# Patient Record
Sex: Male | Born: 1980
Health system: Southern US, Community
[De-identification: ages and names within clinical notes are randomized; demographics above are authoritative.]

## PROBLEM LIST (undated history)

## (undated) DIAGNOSIS — R9431 Abnormal electrocardiogram [ECG] [EKG]: Secondary | ICD-10-CM

## (undated) DIAGNOSIS — R739 Hyperglycemia, unspecified: Secondary | ICD-10-CM

## (undated) DIAGNOSIS — E119 Type 2 diabetes mellitus without complications: Secondary | ICD-10-CM

## (undated) DIAGNOSIS — N200 Calculus of kidney: Secondary | ICD-10-CM

## (undated) DIAGNOSIS — I10 Essential (primary) hypertension: Principal | ICD-10-CM

## (undated) HISTORY — DX: Type 2 diabetes mellitus without complications: E11.9

## (undated) HISTORY — DX: Hyperglycemia, unspecified: R73.9

## (undated) HISTORY — DX: Essential (primary) hypertension: I10

## (undated) HISTORY — DX: Abnormal electrocardiogram (ECG) (EKG): R94.31

## (undated) HISTORY — PX: NECK SURGERY: SHX720

## (undated) HISTORY — DX: Calculus of kidney: N20.0

---

## 2000-11-11 ENCOUNTER — Emergency Department (HOSPITAL_COMMUNITY): Admission: EM | Admit: 2000-11-11 | Discharge: 2000-11-11 | Payer: Self-pay | Admitting: Emergency Medicine

## 2002-03-29 ENCOUNTER — Emergency Department (HOSPITAL_COMMUNITY): Admission: EM | Admit: 2002-03-29 | Discharge: 2002-03-29 | Payer: Self-pay | Admitting: Emergency Medicine

## 2010-03-15 ENCOUNTER — Emergency Department (HOSPITAL_COMMUNITY): Admission: EM | Admit: 2010-03-15 | Discharge: 2010-03-15 | Payer: Self-pay | Admitting: Emergency Medicine

## 2010-07-31 LAB — RAPID URINE DRUG SCREEN, HOSP PERFORMED
Amphetamines: NOT DETECTED
Barbiturates: NOT DETECTED
Benzodiazepines: NOT DETECTED
Cocaine: NOT DETECTED
Opiates: NOT DETECTED
Tetrahydrocannabinol: NOT DETECTED

## 2010-07-31 LAB — POCT I-STAT, CHEM 8
BUN: 10 mg/dL (ref 6–23)
Calcium, Ion: 1.18 mmol/L (ref 1.12–1.32)
Chloride: 106 mEq/L (ref 96–112)
Creatinine, Ser: 1.5 mg/dL (ref 0.4–1.5)
Glucose, Bld: 107 mg/dL — ABNORMAL HIGH (ref 70–99)
HCT: 47 % (ref 39.0–52.0)
Hemoglobin: 16 g/dL (ref 13.0–17.0)
Potassium: 4 mEq/L (ref 3.5–5.1)
Sodium: 140 mEq/L (ref 135–145)
TCO2: 24 mmol/L (ref 0–100)

## 2010-07-31 LAB — D-DIMER, QUANTITATIVE: D-Dimer, Quant: <0.22 ug/mL-FEU (ref 0.00–0.48)

## 2010-07-31 LAB — TROPONIN I: Troponin I: <0.01 ng/mL (ref 0.00–0.06)

## 2010-11-11 ENCOUNTER — Encounter: Payer: Self-pay | Admitting: Family

## 2010-11-11 ENCOUNTER — Ambulatory Visit (INDEPENDENT_AMBULATORY_CARE_PROVIDER_SITE_OTHER): Payer: 59 | Admitting: Family

## 2010-11-11 VITALS — BP 140/102 | HR 102 | Temp 97.8°F | Resp 16 | Ht 71.5 in | Wt 265.1 lb

## 2010-11-11 DIAGNOSIS — Z0000 Encounter for general adult medical examination without abnormal findings: Secondary | ICD-10-CM

## 2010-11-11 DIAGNOSIS — Z23 Encounter for immunization: Secondary | ICD-10-CM

## 2010-11-11 DIAGNOSIS — I10 Essential (primary) hypertension: Secondary | ICD-10-CM | POA: Insufficient documentation

## 2010-11-11 DIAGNOSIS — L918 Other hypertrophic disorders of the skin: Secondary | ICD-10-CM

## 2010-11-11 DIAGNOSIS — R9431 Abnormal electrocardiogram [ECG] [EKG]: Secondary | ICD-10-CM

## 2010-11-11 DIAGNOSIS — E663 Overweight: Secondary | ICD-10-CM

## 2010-11-11 DIAGNOSIS — R002 Palpitations: Secondary | ICD-10-CM | POA: Insufficient documentation

## 2010-11-11 DIAGNOSIS — L909 Atrophic disorder of skin, unspecified: Secondary | ICD-10-CM

## 2010-11-11 HISTORY — DX: Abnormal electrocardiogram (ECG) (EKG): R94.31

## 2010-11-11 HISTORY — DX: Essential (primary) hypertension: I10

## 2010-11-11 LAB — CBC
Hemoglobin: 15.3 g/dL (ref 13.0–17.0)
MCHC: 35.1 g/dL (ref 30.0–36.0)
RDW: 14 % (ref 11.5–15.5)

## 2010-11-11 LAB — HEPATIC FUNCTION PANEL
ALT: 36 U/L (ref 0–53)
AST: 25 U/L (ref 0–37)
Albumin: 4.7 g/dL (ref 3.5–5.2)
Total Bilirubin: 0.4 mg/dL (ref 0.3–1.2)

## 2010-11-11 LAB — BASIC METABOLIC PANEL
BUN: 14 mg/dL (ref 6–23)
Creat: 1.42 mg/dL — ABNORMAL HIGH (ref 0.50–1.35)
Glucose, Bld: 71 mg/dL (ref 70–99)
Potassium: 4.6 mEq/L (ref 3.5–5.3)

## 2010-11-11 LAB — TSH: TSH: 1.185 u[IU]/mL (ref 0.350–4.500)

## 2010-11-11 MED ORDER — ASPIRIN EC 81 MG PO TBEC: 81.0000 mg | DELAYED_RELEASE_TABLET | Freq: Every day | ORAL | Status: DC

## 2010-11-11 MED ORDER — AMLODIPINE BESYLATE 5 MG PO TABS: 5.0000 mg | ORAL_TABLET | Freq: Every day | ORAL | Status: DC

## 2010-11-11 NOTE — Patient Instructions (Addendum)
Www.sparkpeople.com - this is a good website with an app for your smartphone to record calories/exercise. Try to keep your calories to between 1500 and 1700 calories day. Keep up the good work with the exercise.   Complete your blood work on the first floor. Follow up in 1 month- bring a copy of your cholesterol and PSA tests from your health screen with you to your follow up appointment.  You will be contacted about your referral to cardiology. Go to the ER if you develop chest pain.

## 2010-11-11 NOTE — Progress Notes (Signed)
Subjective:    Patient ID: Ralph [name], male    DOB: 1980/08/20, 30 y.o.   MRN: [id number]  HPI  [name] is a 30 yr old male who presents today to establish care.  He was recently told at a work health fair that his blood pressure was elevated.   HTN- told at health fair that his blood pressure is high. Mom and dad have HTN.    Preventative-  Exercise- lifts weights/cardio 5x a week- treadmill/elliptical.  Last tetanus>10 yrs ago.  Diet is "terrible."     Review of Systems  Constitutional: Negative for fever.  HENT: Negative for hearing loss.   Eyes: Negative for visual disturbance.  Respiratory: Negative for chest tightness and wheezing.   Cardiovascular: Positive for palpitations. Negative for chest pain and leg swelling.  Gastrointestinal: Negative for nausea, vomiting, abdominal pain and diarrhea.  Genitourinary: Negative for dysuria, urgency and frequency.  Musculoskeletal: Positive for back pain.  Skin: Negative for wound.       + mole under arm- enlarging.  Neurological: Negative for weakness and numbness.  Hematological: Negative for adenopathy.  Psychiatric/Behavioral:       Denies depression/anxiety.   Past Medical History  Diagnosis Date  . History of chicken pox   . Elevated blood pressure reading     History   Social History  . Marital Status: Single    Spouse Name: N/A    Number of Children: 2  . Years of Education: N/A   Occupational History  .  [employer]   Social History Main Topics  . Smoking status: Former Smoker -- 6 years    Types: Cigarettes    Quit date: 08/18/2010  . Smokeless tobacco: Never Used  . Alcohol Use: Yes     1-2 drinks liquor a month  . Drug Use: No  . Sexually Active: Not on file   Other Topics Concern  . 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 Denies drug  use.      Past Surgical History  Procedure Date  . 1988     Family History  Problem Relation Age of Onset  . Diabetes Father   . Hypertension Maternal Grandfather   . Diabetes Paternal Grandmother   . Cancer Neg Hx   . Heart disease Neg Hx     No Known Allergies  No current outpatient prescriptions on file prior to visit.    BP 140/102  Pulse 102  Temp(Src) 97.8 F (36.6 C) (Oral)  Resp 16  Ht 5' 11.5" (1.816 m)  Wt 265 lb 1.3 oz (120.239 kg)  BMI 36.46 kg/m2        Objective:   Physical Exam  Constitutional: He is oriented to person, place, and time. He appears well-developed and well-nourished.  HENT:  Head: Normocephalic and atraumatic.  Right Ear: Ear canal normal.  Left Ear: Tympanic membrane and ear canal normal.  Mouth/Throat: Uvula is midline, oropharynx is clear and moist and mucous membranes are normal.  Eyes: Conjunctivae are normal. Pupils are equal, round, and reactive to light.  Cardiovascular: Normal rate and regular rhythm.   Pulmonary/Chest: Effort normal and breath sounds normal.  Abdominal: Soft. Bowel sounds are normal.  Musculoskeletal: Normal range of motion. He exhibits no edema and no tenderness.  Neurological: He is alert and oriented  to person, place, and time. He displays normal reflexes.  Skin: Skin is warm and dry.       Skin lesion of concern- right axilla is a skin tag.  Multiple skin tags noted around neck line.           Assessment & Plan:

## 2010-11-11 NOTE — Assessment & Plan Note (Signed)
Tetanus given today.  Pt counseled on diet, exercise, weight loss and low sodium diet. Check baseline laboratories.  He reports that PSA and lipids were completed at his work fair- I have asked him to bring these with him to his follow up appointment.

## 2010-11-11 NOTE — Assessment & Plan Note (Signed)
New problem, will add norvasc, pt to f/u in 1 month.

## 2010-11-11 NOTE — Assessment & Plan Note (Signed)
Refer to cardiology.  Check BMET, TSH, CBC.

## 2010-11-11 NOTE — Assessment & Plan Note (Signed)
EKG performed today notes TWI's leads I, II, III, V5 and V6.  Compared to EKG 03/15/10 and noted that EKG was very similar.  Pt denies chest pain.  Will add a baby aspirin and refer to cardiology for consultation.  Pt was instructed to go to the ER if he develops chest pain.  He verbalizes understanding.

## 2010-11-12 ENCOUNTER — Telehealth: Payer: Self-pay | Admitting: Family

## 2010-11-12 ENCOUNTER — Encounter: Payer: Self-pay | Admitting: Family

## 2010-11-12 NOTE — Telephone Encounter (Signed)
Pt.notified

## 2010-11-12 NOTE — Telephone Encounter (Signed)
Left message on machine to return my call. 

## 2010-11-12 NOTE — Telephone Encounter (Signed)
Pt would like results from yesterdays labs

## 2010-12-05 ENCOUNTER — Encounter: Payer: Self-pay | Admitting: Internal Medicine

## 2010-12-06 ENCOUNTER — Ambulatory Visit (INDEPENDENT_AMBULATORY_CARE_PROVIDER_SITE_OTHER): Payer: 59 | Admitting: Internal Medicine

## 2010-12-06 ENCOUNTER — Encounter: Payer: Self-pay | Admitting: Internal Medicine

## 2010-12-06 VITALS — BP 144/97 | HR 77 | Ht 70.0 in | Wt 259.0 lb

## 2010-12-06 DIAGNOSIS — R9431 Abnormal electrocardiogram [ECG] [EKG]: Secondary | ICD-10-CM

## 2010-12-06 DIAGNOSIS — I10 Essential (primary) hypertension: Secondary | ICD-10-CM

## 2010-12-06 DIAGNOSIS — Z0000 Encounter for general adult medical examination without abnormal findings: Secondary | ICD-10-CM

## 2010-12-06 MED ORDER — AMLODIPINE BESYLATE 5 MG PO TABS: 5.0000 mg | ORAL_TABLET | Freq: Every day | ORAL | Status: DC

## 2010-12-06 NOTE — Progress Notes (Signed)
HPI  Patient is a 30 year old with a history of hypertension.  He was seen in medicine clinic an referred fro further evaluation The patient has had a his blood pressure checked on several occasions and it has been elevated.  He was seen in medicine clinic an given an Rx for Norvasc.  He did not fill the prescription.  An EKG was done though not sent with records He denies chest pain.  Breathing is OK  No palpitations.  No dizziness.  No Known Allergies  No current outpatient prescriptions on file.    Past Medical History  Diagnosis Date  . History of chicken pox   . Elevated blood pressure reading     Past Surgical History  Procedure Date  . 1988     Family History  Problem Relation Age of Onset  . Diabetes Father   . Hypertension Maternal Grandfather   . Diabetes Paternal Grandmother   . Cancer Neg Hx   . Heart disease Neg Hx     History   Social History  . Marital Status: Single    Spouse Name: N/A    Number of Children: 2  . Years of Education: N/A   Occupational History  .  [employer]   Social History Main Topics  . Smoking status: Former Smoker -- 6 years    Types: Cigarettes    Quit date: 08/18/2010  . Smokeless tobacco: Never Used  . Alcohol Use: Yes     1-2 drinks liquor a month  . Drug Use: No  . Sexually Active: Not on file   Other Topics Concern  . 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 Denies drug use.      Review of Systems:  All systems reviewed.  They are negative to the above problem except as previously stated.  Vital Signs: BP 144/97  Pulse 77  Ht 5\' 10"  (1.778 m)  Wt 259 lb (117.482 kg)  BMI 37.16 kg/m2  Physical Exam  Patient is in NAD  HEENT:  Normocephalic, atraumatic. EOMI, PERRLA.  Neck: JVP is normal. No thyromegaly. No bruits.  Lungs: clear to auscultation. No  rales no wheezes.  Heart: Regular rate and rhythm. Normal S1, S2. No S3.   No significant murmurs. PMI not displaced.  Abdomen:  Supple, nontender. Normal bowel sounds. No masses. No hepatomegaly.  Extremities:   Good distal pulses throughout. No lower extremity edema.  Musculoskeletal :moving all extremities.  Neuro:   alert and oriented x3.  CN II-XII grossly intact.  IEG:  Sinus rhythm.  T wave inversion II, III, AVF, V4 to V6.   Assessment and Plan:

## 2010-12-06 NOTE — Patient Instructions (Signed)
Your physician has requested that you have an echocardiogram. Echocardiography is a painless test that uses sound waves to create images of your heart. It provides your doctor with information about the size and shape of your heart and how well your heart's chambers and valves are working. This procedure takes approximately one hour. There are no restrictions for this procedure.  Change Dr.O'Sullivan's appointment to 1 month from now.

## 2010-12-08 NOTE — Assessment & Plan Note (Signed)
May be related to HTN.  Will order echo.

## 2010-12-08 NOTE — Assessment & Plan Note (Signed)
BP is elevated today.  I agree with Norvasc.  He has not filled the prescription yet.  WIll represcribe.   Pateint should have f/u appt in medicine in 4 wks.  (was scheduled for MOnday, will reschedule).

## 2010-12-08 NOTE — Assessment & Plan Note (Signed)
Will set up for fasting lipids when he returns for echo.

## 2010-12-09 ENCOUNTER — Ambulatory Visit: Payer: 59 | Admitting: Family

## 2010-12-09 ENCOUNTER — Ambulatory Visit (HOSPITAL_COMMUNITY): Payer: 59 | Attending: Internal Medicine | Admitting: Radiology

## 2010-12-09 DIAGNOSIS — I079 Rheumatic tricuspid valve disease, unspecified: Secondary | ICD-10-CM | POA: Insufficient documentation

## 2010-12-09 DIAGNOSIS — I059 Rheumatic mitral valve disease, unspecified: Secondary | ICD-10-CM | POA: Insufficient documentation

## 2010-12-09 DIAGNOSIS — I10 Essential (primary) hypertension: Secondary | ICD-10-CM | POA: Insufficient documentation

## 2010-12-09 DIAGNOSIS — R9431 Abnormal electrocardiogram [ECG] [EKG]: Secondary | ICD-10-CM | POA: Insufficient documentation

## 2010-12-13 ENCOUNTER — Telehealth: Payer: Self-pay | Admitting: Internal Medicine

## 2010-12-13 NOTE — Telephone Encounter (Signed)
PT RTN CALL TO RESULTS OF ECHO

## 2010-12-13 NOTE — Telephone Encounter (Signed)
Results of echo given to patient. 

## 2011-01-06 ENCOUNTER — Ambulatory Visit (INDEPENDENT_AMBULATORY_CARE_PROVIDER_SITE_OTHER): Payer: 59 | Admitting: Family

## 2011-01-06 ENCOUNTER — Encounter: Payer: Self-pay | Admitting: Family

## 2011-01-06 DIAGNOSIS — R9431 Abnormal electrocardiogram [ECG] [EKG]: Secondary | ICD-10-CM

## 2011-01-06 DIAGNOSIS — I10 Essential (primary) hypertension: Secondary | ICD-10-CM

## 2011-01-06 MED ORDER — OLMESARTAN MEDOXOMIL 20 MG PO TABS: 20.0000 mg | ORAL_TABLET | Freq: Every day | ORAL | Status: DC

## 2011-01-06 NOTE — Progress Notes (Signed)
  Subjective:    Patient ID: Ralph [name], male    DOB: 1980/08/15, 30 y.o.   MRN: [id number]  HPI  [name] is a 30 yr old male who presents today for follow up of his HTN.     HTN- pt was recently diagnosed with HTN.   He is currently on amlodipine, and not well controlled. He complains of erectile dysfunction on Amlodipine and would like to try a different agent. No associated S/S related to HTN.   Quality: chronic Modifying factor: meds Duration: Diagnosed about 1 month ago.  Associated S/S: None.  The patient denies the following associated symptoms: Chest pain, dyspnea, blurred vision, headache, or lower extremity edema.       Review of Systems See HPI  Past Medical History  Diagnosis Date  . History of chicken pox   . Elevated blood pressure reading     History   Social History  . Marital Status: Single    Spouse Name: N/A    Number of Children: 2  . Years of Education: N/A   Occupational History  .  [employer]   Social History Main Topics  . Smoking status: Former Smoker -- 6 years    Types: Cigarettes    Quit date: 08/18/2010  . Smokeless tobacco: Never Used  . Alcohol Use: Yes     1-2 drinks liquor a month  . Drug Use: No  . Sexually Active: Not on file   Other Topics Concern  . 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 Denies drug use.      Past Surgical History  Procedure Date  . 1988     Family History  Problem Relation Age of Onset  . Diabetes Father   . Hypertension Maternal Grandfather   . Diabetes Paternal Grandmother   . Cancer Neg Hx   . Heart disease Neg Hx     No Known Allergies  No current outpatient prescriptions on file prior to visit.    BP 140/100  Pulse 78  Temp(Src) 97.8 F (36.6 C) (Oral)  Resp 16  Ht 5' 11.5" (1.816 m)  Wt 259 lb 1.3 oz (117.518  kg)  BMI 35.63 kg/m2       Objective:   Physical Exam  Constitutional: He appears well-developed and well-nourished.  Cardiovascular: Normal rate and regular rhythm.   Pulmonary/Chest: Effort normal and breath sounds normal. No respiratory distress. He has no wheezes. He has no rales. He exhibits no tenderness.  Musculoskeletal: He exhibits no edema.  Psychiatric: He has a normal mood and affect. His behavior is normal. Judgment and thought content normal.          Assessment & Plan:  [id number] exp 08/14

## 2011-01-06 NOTE — Patient Instructions (Signed)
Stop amlodipine, start benicar. Follow up in 2 weeks.

## 2011-01-06 NOTE — Assessment & Plan Note (Signed)
Saw Cardiology- Dr. Tenny Craw.  Had Echo performed which showed normal LVEF.  No further work up planned per cards.

## 2011-01-06 NOTE — Assessment & Plan Note (Signed)
BP is unchanged.  He wishes to stop amlodipine. Will give him a trial of Benicar.  Pt to f/u in 2 weeks.

## 2011-01-27 ENCOUNTER — Ambulatory Visit (INDEPENDENT_AMBULATORY_CARE_PROVIDER_SITE_OTHER): Payer: 59 | Admitting: Family

## 2011-01-27 ENCOUNTER — Encounter: Payer: Self-pay | Admitting: Family

## 2011-01-27 VITALS — BP 132/86 | HR 84 | Temp 98.1°F | Resp 16 | Ht 71.5 in | Wt 261.0 lb

## 2011-01-27 DIAGNOSIS — R9431 Abnormal electrocardiogram [ECG] [EKG]: Secondary | ICD-10-CM

## 2011-01-27 DIAGNOSIS — I10 Essential (primary) hypertension: Secondary | ICD-10-CM

## 2011-01-27 LAB — BASIC METABOLIC PANEL
Potassium: 4.4 mEq/L (ref 3.5–5.3)
Sodium: 137 mEq/L (ref 135–145)

## 2011-01-27 MED ORDER — OLMESARTAN MEDOXOMIL 20 MG PO TABS: 20.0000 mg | ORAL_TABLET | Freq: Every day | ORAL | Status: DC

## 2011-01-27 NOTE — Assessment & Plan Note (Signed)
BP Readings from Last 3 Encounters:  01/27/11 132/86  01/06/11 140/100  12/06/10 144/97  BP is improved.  Recommended to the patient that he start back on the Benicar and return in 2 weeks to have BP rechecked as I would like to see his pressure on the Benicar. Check BMET today.

## 2011-01-27 NOTE — Patient Instructions (Signed)
Please complete your blood work on the first floor. Stop amlodipine, start back on benicar. Follow up in 2 weeks for a nurse visit blood pressure check. Follow up in 3 months for a regular appointment. Keep working hard on a low sodium diet, exercise and weight loss.

## 2011-01-27 NOTE — Progress Notes (Signed)
  Subjective:    Patient ID: KOTY ANCTIL, male    DOB: 1981-04-29, 30 y.o.   MRN: [id number]  HPI  Mr.  Deren is a 30 yr old male who presents today for follow up of his HTN.   1) HTN- He had originally been started on amlodipine but this was changed to benicar due to ED side effect.  He reports that he has tolerated the benicar without any adverse side effects such as ED.  He did run out of the the Benicar 3 days ago and temporarily started back on amlodipine.   Quality: chronic Modifying factor: meds Duration: Quite sometime Associated S/S: None.  The patient denies the following associated symptoms: Chest pain, dyspnea, headache, or lower extremity edema.  2) Abnormal EKG- pt was seen by Dr. Tenny Craw cardiology and it was recommended that he add a baby aspirin to his regimen and complete a 2-D echo.  2D Echo noted normal LVEF 55-60%     Review of Systems See HPI  Past Medical History  Diagnosis Date  . History of chicken pox   . Elevated blood pressure reading     History   Social History  . Marital Status: Single    Spouse Name: N/A    Number of Children: 2  . Years of Education: N/A   Occupational History  .  [employer]   Social History Main Topics  . Smoking status: Former Smoker -- 6 years    Types: Cigarettes    Quit date: 08/18/2010  . Smokeless tobacco: Never Used  . Alcohol Use: Yes     1-2 drinks liquor a month  . Drug Use: No  . Sexually Active: Not on file   Other Topics Concern  . 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 Denies drug use.      Past Surgical History  Procedure Date  . 1988     Family History  Problem Relation Age of Onset  . Diabetes Father   . Hypertension Maternal Grandfather   . Diabetes Paternal Grandmother   . Cancer Neg Hx   . Heart disease Neg Hx     No  Known Allergies  No current outpatient prescriptions on file prior to visit.    BP 132/86  Pulse 84  Temp(Src) 98.1 F (36.7 C) (Oral)  Resp 16  Ht 5' 11.5" (1.816 m)  Wt 261 lb (118.389 kg)  BMI 35.89 kg/m2       Objective:   Physical Exam  Constitutional: He appears well-developed and well-nourished.  Cardiovascular: Normal rate and regular rhythm.   No murmur heard. Pulmonary/Chest: Effort normal and breath sounds normal. No respiratory distress. He has no wheezes. He has no rales. He exhibits no tenderness.  Musculoskeletal: He exhibits no edema.  Psychiatric: His behavior is normal. Judgment and thought content normal.          Assessment & Plan:  benicar 20 lot [id number] exp 10/14 #21

## 2011-01-27 NOTE — Assessment & Plan Note (Signed)
Was evaluated by Dr. Tenny Craw.  Echo looks good.

## 2011-01-30 ENCOUNTER — Telehealth: Payer: Self-pay | Admitting: Family

## 2011-01-30 DIAGNOSIS — R739 Hyperglycemia, unspecified: Secondary | ICD-10-CM

## 2011-01-30 HISTORY — DX: Hyperglycemia, unspecified: R73.9

## 2011-01-30 NOTE — Telephone Encounter (Signed)
Pls call pt and let him know that his sugar is mildly elevated.  I would like for him to return for an A1C (Hyperglycemia) to evaluate for diabetes.

## 2011-01-31 NOTE — Telephone Encounter (Signed)
Pt notified and will return Monday for hemoglobin a1c. Lab order entered and forwarded to the lab.

## 2011-01-31 NOTE — Telephone Encounter (Signed)
Left detailed message on machine for pt to return my call re: additional lab test needed.

## 2011-02-03 LAB — HEMOGLOBIN A1C
Hgb A1c MFr Bld: 5.7 % — ABNORMAL HIGH (ref <5.7)
Mean Plasma Glucose: 117 mg/dL — ABNORMAL HIGH (ref <117)

## 2011-02-07 ENCOUNTER — Telehealth: Payer: Self-pay | Admitting: Family

## 2011-02-07 NOTE — Telephone Encounter (Addendum)
Please let pt know that I forgot to mention to him today at his appointment that his sugar was mildly elevated, though not in diabetic range.  He is at increased risk of developing diabetes later in life. He should work on exercise, weight loss and avoid concentrated sweets. Follow up in 3 months.

## 2011-02-10 ENCOUNTER — Ambulatory Visit (INDEPENDENT_AMBULATORY_CARE_PROVIDER_SITE_OTHER): Payer: 59 | Admitting: Family

## 2011-02-10 VITALS — BP 134/96 | HR 78 | Resp 16

## 2011-02-10 DIAGNOSIS — I10 Essential (primary) hypertension: Secondary | ICD-10-CM

## 2011-02-10 MED ORDER — HYDROCHLOROTHIAZIDE 25 MG PO TABS: 25.0000 mg | ORAL_TABLET | Freq: Every day | ORAL | Status: DC

## 2011-02-10 NOTE — Progress Notes (Signed)
  Subjective:    Patient ID: Ralph [name], male    DOB: 17-Apr-1981, 30 y.o.   MRN: [id number]  HPI  Patient presents today for followup of hypertension.  Patient has been treated for Chronic HTN for quiet sometime.He is currently on benicar , and well controlled. No associated S/S related to HTN.   Quality: chronic Modifying factor: meds Duration: Quite sometime Associated S/S: None.  The patient denies the following associated symptoms: Chest pain, dyspnea, blurred vision, headache, or lower extremity edema.     Review of Systems See HPI  Past Medical History  Diagnosis Date  . History of chicken pox   . Elevated blood pressure reading     History   Social History  . Marital Status: Single    Spouse Name: N/A    Number of Children: 2  . Years of Education: N/A   Occupational History  .  [employer]   Social History Main Topics  . Smoking status: Former Smoker -- 6 years    Types: Cigarettes    Quit date: 08/18/2010  . Smokeless tobacco: Never Used  . Alcohol Use: Yes     1-2 drinks liquor a month  . Drug Use: No  . Sexually Active: Not on file   Other Topics Concern  . 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 Denies drug use.      Past Surgical History  Procedure Date  . 1988     Family History  Problem Relation Age of Onset  . Diabetes Father   . Hypertension Maternal Grandfather   . Diabetes Paternal Grandmother   . Cancer Neg Hx   . Heart disease Neg Hx     No Known Allergies  Current Outpatient Prescriptions on File Prior to Visit  Medication Sig Dispense Refill  . olmesartan (BENICAR) 20 MG tablet Take 1 tablet (20 mg total) by mouth daily.  30 tablet  2    BP 134/96  Pulse 78  Resp 16       Objective:   Physical Exam  Constitutional: He appears well-developed and  well-nourished.  Cardiovascular: Normal rate and regular rhythm.   No murmur heard. Pulmonary/Chest: Effort normal and breath sounds normal. No respiratory distress. He has no wheezes. He has no rales. He exhibits no tenderness.  Skin: He is not diaphoretic.          Assessment & Plan:

## 2011-02-10 NOTE — Telephone Encounter (Signed)
Notified pt. Advised him to keep f/u in November for his blood pressure and would repeat labs again in 3 months per verbal from Sandford Craze, NP.

## 2011-02-10 NOTE — Assessment & Plan Note (Signed)
BP Readings from Last 3 Encounters:  02/10/11 134/96  01/27/11 132/86  01/06/11 140/100  Diastolic BP remains elevated. Will continue benicar and add HCTZ to his regimen.

## 2011-02-10 NOTE — Patient Instructions (Signed)
Please follow up in 1 month.  

## 2011-04-07 ENCOUNTER — Ambulatory Visit: Payer: 59 | Admitting: Family

## 2011-04-14 ENCOUNTER — Ambulatory Visit: Payer: 59 | Admitting: Family

## 2011-04-14 DIAGNOSIS — Z0289 Encounter for other administrative examinations: Secondary | ICD-10-CM

## 2011-06-03 ENCOUNTER — Other Ambulatory Visit: Payer: Self-pay | Admitting: Family

## 2011-06-30 ENCOUNTER — Encounter: Payer: Self-pay | Admitting: Family

## 2011-06-30 ENCOUNTER — Ambulatory Visit (INDEPENDENT_AMBULATORY_CARE_PROVIDER_SITE_OTHER): Payer: 59 | Admitting: Family

## 2011-06-30 DIAGNOSIS — I10 Essential (primary) hypertension: Secondary | ICD-10-CM

## 2011-06-30 DIAGNOSIS — Z0000 Encounter for general adult medical examination without abnormal findings: Secondary | ICD-10-CM

## 2011-06-30 LAB — BASIC METABOLIC PANEL
Chloride: 105 mEq/L (ref 96–112)
Potassium: 4.6 mEq/L (ref 3.5–5.3)

## 2011-06-30 MED ORDER — OLMESARTAN MEDOXOMIL 20 MG PO TABS: 20.0000 mg | ORAL_TABLET | Freq: Every day | ORAL | Status: DC

## 2011-06-30 MED ORDER — HYDROCHLOROTHIAZIDE 25 MG PO TABS: 25.0000 mg | ORAL_TABLET | Freq: Every day | ORAL | Status: DC

## 2011-06-30 NOTE — Patient Instructions (Signed)
Restart HCTZ. Please follow up in 3 months, sooner if problems/concerns.

## 2011-06-30 NOTE — Progress Notes (Signed)
  Subjective:    Patient ID: Ralph [name], male    DOB: 1980/10/11, 31 y.o.   MRN: [id number]  HPI  Patient presents today for followup of hypertension.  Patient has been treated for Chronic HTN for quiet sometime. He is currently on benicar with fair control. Denies S/S related to HTN.   Quality: chronic Modifying factor: meds Duration: Quite sometime Associated S/S: None.  The patient denies the following associated symptoms: Chest pain, dyspnea, blurred vision, headache, or lower extremity edema.     Review of Systems See HPI  Past Medical History  Diagnosis Date  . History of chicken pox   . Elevated blood pressure reading     History   Social History  . Marital Status: Single    Spouse Name: N/A    Number of Children: 2  . Years of Education: N/A   Occupational History  .  [employer]   Social History Main Topics  . Smoking status: Former Smoker -- 6 years    Types: Cigarettes    Quit date: 08/18/2010  . Smokeless tobacco: Never Used  . Alcohol Use: Yes     1-2 drinks liquor a month  . Drug Use: No  . Sexually Active: Not on file   Other Topics Concern  . 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 Denies drug use.      Past Surgical History  Procedure Date  . 1988     Family History  Problem Relation Age of Onset  . Diabetes Father   . Hypertension Maternal Grandfather   . Diabetes Paternal Grandmother   . Cancer Neg Hx   . Heart disease Neg Hx     No Known Allergies  Current Outpatient Prescriptions on File Prior to Visit  Medication Sig Dispense Refill  . BENICAR 20 MG tablet TAKE ONE TABLET BY MOUTH EVERY DAY  30 each  0  . hydrochlorothiazide (HYDRODIURIL) 25 MG tablet Take 1 tablet (25 mg total) by mouth daily.  30 tablet  1    BP 134/92  Pulse 76  Temp(Src) 98 F (36.7 C)  (Oral)  Resp 16  Ht 5' 11.5" (1.816 m)  SpO2 99%       Objective:   Physical Exam  Constitutional: He appears well-developed and well-nourished. No distress.  Cardiovascular: Normal rate and regular rhythm.   No murmur heard. Pulmonary/Chest: Effort normal and breath sounds normal. No respiratory distress. He has no wheezes. He has no rales. He exhibits no tenderness.  Musculoskeletal: He exhibits no edema.  Psychiatric: He has a normal mood and affect. Judgment and thought content normal.          Assessment & Plan:

## 2011-06-30 NOTE — Assessment & Plan Note (Signed)
BP Readings from Last 3 Encounters:  06/30/11 134/92  02/10/11 134/96  01/27/11 132/86  DBP is slightly above goal. Obtain BMET, continue benicar, resume HCTZ, follow up in 3 months.

## 2011-07-01 ENCOUNTER — Encounter: Payer: Self-pay | Admitting: Family

## 2011-07-01 LAB — LIPID PANEL
HDL: 42 mg/dL (ref >39)
LDL Cholesterol: 118 mg/dL — ABNORMAL HIGH (ref 0–99)
Triglycerides: 153 mg/dL — ABNORMAL HIGH (ref <150)
VLDL: 31 mg/dL (ref 0–40)

## 2011-09-29 ENCOUNTER — Ambulatory Visit (INDEPENDENT_AMBULATORY_CARE_PROVIDER_SITE_OTHER): Payer: 59 | Admitting: Family

## 2011-09-29 ENCOUNTER — Ambulatory Visit (INDEPENDENT_AMBULATORY_CARE_PROVIDER_SITE_OTHER)
Admission: RE | Admit: 2011-09-29 | Discharge: 2011-09-29 | Disposition: A | Discharge status: Home / Self Care | Payer: 59 | Source: Ambulatory Visit | Attending: Family | Admitting: Family

## 2011-09-29 ENCOUNTER — Telehealth: Payer: Self-pay | Admitting: Family

## 2011-09-29 ENCOUNTER — Encounter: Payer: Self-pay | Admitting: Family

## 2011-09-29 VITALS — BP 124/86 | HR 89 | Temp 97.9°F | Resp 16 | Ht 71.5 in | Wt 266.0 lb

## 2011-09-29 DIAGNOSIS — N200 Calculus of kidney: Secondary | ICD-10-CM

## 2011-09-29 DIAGNOSIS — R109 Unspecified abdominal pain: Secondary | ICD-10-CM

## 2011-09-29 DIAGNOSIS — I10 Essential (primary) hypertension: Secondary | ICD-10-CM

## 2011-09-29 HISTORY — DX: Calculus of kidney: N20.0

## 2011-09-29 LAB — POCT URINALYSIS DIPSTICK
Nitrite, UA: NEGATIVE
Urobilinogen, UA: 0.2
pH, UA: 6

## 2011-09-29 MED ORDER — TAMSULOSIN HCL 0.4 MG PO CAPS: 0.4000 mg | ORAL_CAPSULE | Freq: Every day | ORAL | Status: DC

## 2011-09-29 MED ORDER — TRAMADOL HCL 50 MG PO TABS: 50.0000 mg | ORAL_TABLET | Freq: Three times a day (TID) | ORAL | Status: AC | PRN

## 2011-09-29 NOTE — Telephone Encounter (Signed)
Spoke with pt. Reviewed as below.    Judeth Cornfield- would you pls call pt to arrange a 1 week follow up appointment? thanks

## 2011-09-29 NOTE — Telephone Encounter (Signed)
Made patient an appointment for 10/06/11

## 2011-09-29 NOTE — Progress Notes (Signed)
  Subjective:    Patient ID: Ralph [name], male    DOB: 02-12-1981, 31 y.o.   MRN: [id number]  HPI [name] is a 31 yr old male who presents today for follow up of his htn.  He also wishes to discuss flank pain..   R flank pain- symptoms started 2.5 weeks ago. Pain is described as a dull ache.  He denies associated fever or hematuria. No previous hx of kidney stones or similar back pain.  He has not tried any medication for the pain.  Pain remains unchanged.   HTN- Reports compliance with medications. Tolerating bp meds. Denies cp, swelling, shorntess of breath.   Review of Systems see HPI    Past Medical History  Diagnosis Date  . History of chicken pox   . Elevated blood pressure reading     History   Social History  . Marital Status: Single    Spouse Name: N/A    Number of Children: 2  . Years of Education: N/A   Occupational History  .  [employer]   Social History Main Topics  . Smoking status: Former Smoker -- 6 years    Types: Cigarettes    Quit date: 08/18/2010  . Smokeless tobacco: Never Used  . Alcohol Use: Yes     1-2 drinks liquor a month  . Drug Use: No  . Sexually Active: Not on file   Other Topics Concern  . 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 Denies drug use.      Past Surgical History  Procedure Date  . 1988     Family History  Problem Relation Age of Onset  . Diabetes Father   . Hypertension Maternal Grandfather   . Diabetes Paternal Grandmother   . Cancer Neg Hx   . Heart disease Neg Hx     No Known Allergies  Current Outpatient Prescriptions on File Prior to Visit  Medication Sig Dispense Refill  . hydrochlorothiazide (HYDRODIURIL) 25 MG tablet Take 1 tablet (25 mg total) by mouth daily.  30 tablet  3  . olmesartan (BENICAR) 20 MG tablet Take 1 tablet (20 mg total) by  mouth daily.  30 tablet  3    BP 124/86  Pulse 89  Temp(Src) 97.9 F (36.6 C) (Oral)  Resp 16  Ht 5' 11.5" (1.816 m)  Wt 266 lb 0.6 oz (120.675 kg)  BMI 36.59 kg/m2  SpO2 98%    Objective:   Physical Exam  Constitutional: He is oriented to person, place, and time. He appears well-developed and well-nourished. No distress.  Cardiovascular: Normal rate and regular rhythm.   No murmur heard. Pulmonary/Chest: Effort normal and breath sounds normal. No respiratory distress. He has no wheezes. He has no rales. He exhibits no tenderness.  Abdominal: Soft. There is no tenderness.  Genitourinary:       Neg CVAT bilaterally.   Neurological: He is alert and oriented to person, place, and time.  Skin: Skin is warm and dry.  Psychiatric: He has a normal mood and affect. His behavior is normal. Judgment and thought content normal.          Assessment & Plan:

## 2011-09-29 NOTE — Patient Instructions (Signed)
Please complete your CT on the first floor. We will call you with results.

## 2011-09-29 NOTE — Telephone Encounter (Signed)
Left message for pt to return our call.  Reviewed CT scan, questionable tiny kidney stone in right ureter.  Also possible cyst left kidney which is not well visualized.  I would like for him to start flomax once daily for 1 week, follow up with me in 1 week.  We will plan to repeat CT with contrast at that time to make sure stone has passed and to further evaluate the left kidney. Rx sent for tramadol PRN- do not drive or operate heavy machinery while using. Can use tylenol during the day.

## 2011-09-29 NOTE — Assessment & Plan Note (Signed)
CT abd/pelvis performed which notes ? 1 mm ureteral stone on right.  Will plan to treat with flomax and tramadol.  Repeat CT in 1 week with contrast to follow up on right ureteral stone and to further evaluate ? Left renal cyst noted on left kidney.

## 2011-09-29 NOTE — Assessment & Plan Note (Signed)
BP stable on current meds. Continue same.  

## 2011-10-06 ENCOUNTER — Encounter: Payer: Self-pay | Admitting: Family

## 2011-10-06 ENCOUNTER — Ambulatory Visit (INDEPENDENT_AMBULATORY_CARE_PROVIDER_SITE_OTHER): Payer: 59 | Admitting: Family

## 2011-10-06 ENCOUNTER — Ambulatory Visit (HOSPITAL_BASED_OUTPATIENT_CLINIC_OR_DEPARTMENT_OTHER)
Admission: RE | Admit: 2011-10-06 | Discharge: 2011-10-06 | Disposition: A | Discharge status: Home / Self Care | Payer: 59 | Source: Ambulatory Visit | Attending: Family | Admitting: Family

## 2011-10-06 ENCOUNTER — Other Ambulatory Visit: Payer: 59

## 2011-10-06 VITALS — BP 122/84 | HR 91 | Temp 97.9°F | Resp 16 | Ht 71.5 in | Wt 268.0 lb

## 2011-10-06 DIAGNOSIS — N289 Disorder of kidney and ureter, unspecified: Secondary | ICD-10-CM

## 2011-10-06 DIAGNOSIS — R109 Unspecified abdominal pain: Secondary | ICD-10-CM

## 2011-10-06 DIAGNOSIS — N2889 Other specified disorders of kidney and ureter: Secondary | ICD-10-CM

## 2011-10-06 MED ORDER — MELOXICAM 7.5 MG PO TABS: 7.5000 mg | ORAL_TABLET | Freq: Every day | ORAL | Status: DC

## 2011-10-06 NOTE — Progress Notes (Signed)
  Subjective:    Patient ID: Ralph [name], male    DOB: 11-Mar-1981, 31 y.o.   MRN: [id number]  HPI  Ralph [name] is  31 yr old male who presents today for follow up of his right flank pain. Non-contrasted Ct last visit questioned small 1 mm stones in the right ureter and also noted a right kidney lesion? Cyst. He was placed empirically on flomax. He reports no improvement in his right sided flank pain since last week. Denies blood in urine.   Review of Systems    see hpi  Past Medical History  Diagnosis Date  . History of chicken pox   . Elevated blood pressure reading     History   Social History  . Marital Status: Single    Spouse Name: N/A    Number of Children: 2  . Years of Education: N/A   Occupational History  .  [employer]   Social History Main Topics  . Smoking status: Former Smoker -- 6 years    Types: Cigarettes    Quit date: 08/18/2010  . Smokeless tobacco: Never Used  . Alcohol Use: Yes     1-2 drinks liquor a month  . Drug Use: No  . Sexually Active: Not on file   Other Topics Concern  . 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 Denies drug use.      Past Surgical History  Procedure Date  . 1988     Family History  Problem Relation Age of Onset  . Diabetes Father   . Hypertension Maternal Grandfather   . Diabetes Paternal Grandmother   . Cancer Neg Hx   . Heart disease Neg Hx     No Known Allergies  Current Outpatient Prescriptions on File Prior to Visit  Medication Sig Dispense Refill  . hydrochlorothiazide (HYDRODIURIL) 25 MG tablet Take 1 tablet (25 mg total) by mouth daily.  30 tablet  3  . olmesartan (BENICAR) 20 MG tablet Take 1 tablet (20 mg total) by mouth daily.  30 tablet  3  . Omega-3 Fatty Acids (FISH OIL) 1200 MG CAPS Take 1 capsule by mouth daily.      . Tamsulosin HCl  (FLOMAX) 0.4 MG CAPS Take 1 capsule (0.4 mg total) by mouth daily.  15 capsule  0  . traMADol (ULTRAM) 50 MG tablet Take 1 tablet (50 mg total) by mouth every 8 (eight) hours as needed for pain.  20 tablet  0    BP 122/84  Pulse 91  Temp(Src) 97.9 F (36.6 C) (Oral)  Resp 16  Ht 5' 11.5" (1.816 m)  Wt 268 lb (121.564 kg)  BMI 36.86 kg/m2  SpO2 99%    Objective:   Physical Exam  Constitutional: He appears well-developed and well-nourished. No distress.  Cardiovascular: Normal rate and regular rhythm.   No murmur heard. Pulmonary/Chest: Effort normal and breath sounds normal. No respiratory distress. He has no wheezes. He has no rales. He exhibits no tenderness.  Musculoskeletal:       No tenderness to palpation of bright flank area.          Assessment & Plan:

## 2011-10-06 NOTE — Assessment & Plan Note (Signed)
Unchanged. Spoke with radiologist who reviewed films. He does not believe that there are stones in the right ureter. He recommended mri over ct to better characterize the renal lesion. I actually supect that his pain is musculoskeletal in nature. Wil add meloxicam.

## 2011-10-06 NOTE — Patient Instructions (Signed)
Please complete your CT on the first floor.  We will contact you with the results.   

## 2011-10-07 ENCOUNTER — Other Ambulatory Visit (HOSPITAL_BASED_OUTPATIENT_CLINIC_OR_DEPARTMENT_OTHER): Payer: 59

## 2011-10-07 ENCOUNTER — Telehealth: Payer: Self-pay | Admitting: *Deleted

## 2011-10-07 DIAGNOSIS — N2889 Other specified disorders of kidney and ureter: Secondary | ICD-10-CM

## 2011-10-07 NOTE — Telephone Encounter (Signed)
Received call from [facility] that radiology is requiring pt to have bun/creat within the last 30 days due to his history of HTN. Pt was notified of need for stat labs in order to proceed with MRI abd/pelvis. Pt will come to the lab today. Future order placed and given to the lab.

## 2011-10-10 ENCOUNTER — Telehealth: Payer: Self-pay | Admitting: Family

## 2011-10-10 NOTE — Telephone Encounter (Signed)
Pt returned call stating he has been unable to return for labwork due to a medical emergency with his child. Thinks he should be able to have bloodwork completed on Tuesday. MRI has been r/s for 10/18/11 at 3pm.

## 2011-10-10 NOTE — Telephone Encounter (Signed)
Attempted to reach pt, left detailed message on cell# that labs will need to be completed or MRI cannot be performed tomorrow. Asked pt to return to the lab today or call me if he will not be able to.

## 2011-10-10 NOTE — Telephone Encounter (Signed)
Message copied by Sandford Craze on Fri Oct 10, 2011 11:28 AM ------      Message from: Eulah Pont      Created: Fri Oct 10, 2011  9:44 AM       Patient scheduled for MRI tomorrow,but has not come in for his labs,  Left messages for two days.

## 2011-10-10 NOTE — Telephone Encounter (Signed)
Pls call pt and remind him that he will need complete labs prior to MRI tomorrow.

## 2011-10-11 ENCOUNTER — Ambulatory Visit (HOSPITAL_BASED_OUTPATIENT_CLINIC_OR_DEPARTMENT_OTHER): Admission: RE | Admit: 2011-10-11 | Payer: 59 | Source: Ambulatory Visit

## 2011-10-14 ENCOUNTER — Telehealth: Payer: Self-pay | Admitting: *Deleted

## 2011-10-14 LAB — BASIC METABOLIC PANEL
BUN: 13 mg/dL (ref 6–23)
Chloride: 103 mEq/L (ref 96–112)
Creat: 1.32 mg/dL (ref 0.50–1.35)

## 2011-10-14 NOTE — Telephone Encounter (Signed)
Notified pt. 

## 2011-10-14 NOTE — Telephone Encounter (Signed)
Pt presented to the lab, future order released. 

## 2011-10-14 NOTE — Telephone Encounter (Signed)
Received call from [place] at [facility] to let us know pt's STAT BMP was final. Result is in EPIC. Pt is scheduled for MRI abd/pelvis on Saturday.

## 2011-10-14 NOTE — Telephone Encounter (Signed)
Addended by: Mervin Kung A on: 10/14/2011 09:55 AM   Modules accepted: Orders

## 2011-10-14 NOTE — Telephone Encounter (Signed)
Results reviewed-normal.  Pls let pt know ok to proceed with MRI.

## 2011-10-18 ENCOUNTER — Ambulatory Visit (HOSPITAL_BASED_OUTPATIENT_CLINIC_OR_DEPARTMENT_OTHER)
Admission: RE | Admit: 2011-10-18 | Discharge: 2011-10-18 | Disposition: A | Discharge status: Home / Self Care | Payer: 59 | Source: Ambulatory Visit | Attending: Family | Admitting: Family

## 2011-10-18 DIAGNOSIS — N2889 Other specified disorders of kidney and ureter: Secondary | ICD-10-CM

## 2011-10-18 DIAGNOSIS — N289 Disorder of kidney and ureter, unspecified: Secondary | ICD-10-CM | POA: Insufficient documentation

## 2011-10-18 DIAGNOSIS — R1031 Right lower quadrant pain: Secondary | ICD-10-CM | POA: Insufficient documentation

## 2011-10-18 MED ORDER — GADOBENATE DIMEGLUMINE 529 MG/ML IV SOLN
20.0000 mL | Freq: Once | INTRAVENOUS | Status: AC | PRN
  Administered 2011-10-18: 20 mL via INTRAVENOUS

## 2011-11-22 ENCOUNTER — Other Ambulatory Visit: Payer: Self-pay | Admitting: Family

## 2011-11-24 ENCOUNTER — Other Ambulatory Visit: Payer: Self-pay | Admitting: Family

## 2011-12-29 ENCOUNTER — Other Ambulatory Visit: Payer: Self-pay | Admitting: Family

## 2011-12-30 ENCOUNTER — Other Ambulatory Visit: Payer: Self-pay | Admitting: Family

## 2011-12-30 NOTE — Telephone Encounter (Signed)
Benicar refill denied as pt should still have refills on file from 11/25/11 authorization of #30 x 2 refills. Denial and note sent to pharmacy to check refills on file.

## 2011-12-31 NOTE — Telephone Encounter (Signed)
Left detailed message on [facility]'s voicemail that Benicar request was denied as pt should still have refills on file from 11/24/11 rx and to call if any questions.

## 2012-01-05 ENCOUNTER — Encounter: Payer: Self-pay | Admitting: Family

## 2012-01-05 ENCOUNTER — Ambulatory Visit (INDEPENDENT_AMBULATORY_CARE_PROVIDER_SITE_OTHER): Payer: 59 | Admitting: Family

## 2012-01-05 VITALS — BP 124/92 | HR 91 | Temp 98.7°F | Resp 16 | Wt 260.0 lb

## 2012-01-05 DIAGNOSIS — Q181 Preauricular sinus and cyst: Secondary | ICD-10-CM | POA: Insufficient documentation

## 2012-01-05 DIAGNOSIS — H61899 Other specified disorders of external ear, unspecified ear: Secondary | ICD-10-CM

## 2012-01-05 MED ORDER — CEPHALEXIN 500 MG PO CAPS: 500.0000 mg | ORAL_CAPSULE | Freq: Four times a day (QID) | ORAL | Status: AC

## 2012-01-05 NOTE — Patient Instructions (Addendum)
Please call if you develop fever, increased swelling, if symptoms worsen, or if not improved in 2-3 days.

## 2012-01-05 NOTE — Progress Notes (Signed)
  Subjective:    Patient ID: Ralph [name], male    DOB: January 23, 1981, 31 y.o.   MRN: [id number]  HPI  Ralph [name] is a 31 yr old male who presents today with chief complaint of right ear problem. He reports pain in the right ear canal with tenderness surrounding the ear.  Reports associated enlarged lymph nodes.  Symptoms present x 1 week.   Review of Systems See HPI  Past Medical History  Diagnosis Date  . History of chicken pox   . Elevated blood pressure reading     History   Social History  . Marital Status: Single    Spouse Name: N/A    Number of Children: 2  . Years of Education: N/A   Occupational History  .  [employer]   Social History Main Topics  . Smoking status: Former Smoker -- 6 years    Types: Cigarettes    Quit date: 08/18/2010  . Smokeless tobacco: Never Used  . Alcohol Use: Yes     1-2 drinks liquor a month  . Drug Use: No  . Sexually Active: Not on file   Other Topics Concern  . 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 Denies drug use.      Past Surgical History  Procedure Date  . 1988     Family History  Problem Relation Age of Onset  . Diabetes Father   . Hypertension Maternal Grandfather   . Diabetes Paternal Grandmother   . Cancer Neg Hx   . Heart disease Neg Hx     No Known Allergies  Current Outpatient Prescriptions on File Prior to Visit  Medication Sig Dispense Refill  . BENICAR 20 MG tablet TAKE ONE TABLET BY MOUTH EVERY DAY  30 each  2  . hydrochlorothiazide (HYDRODIURIL) 25 MG tablet TAKE ONE TABLET BY MOUTH EVERY DAY  30 tablet  2  . Omega-3 Fatty Acids (FISH OIL) 1200 MG CAPS Take 1 capsule by mouth daily.      . meloxicam (MOBIC) 7.5 MG tablet Take 1 tablet (7.5 mg total) by mouth daily.  10 tablet  0  . Tamsulosin HCl (FLOMAX) 0.4 MG CAPS Take 1 capsule (0.4 mg  total) by mouth daily.  15 capsule  0    BP 124/92  Pulse 91  Temp 98.7 F (37.1 C) (Oral)  Resp 16  Wt 260 lb (117.935 kg)  SpO2 97%       Objective:   Physical Exam  HENT:  Head: Normocephalic and atraumatic.       Right ear canal- small firm tender red bump noted.  Slight tenderness surrounding the right ear.  Mild swelling surrounding ear without erythema.  Mild pre-auricular LN enlargement is noted.    Eyes: Conjunctivae are normal. Pupils are equal, round, and reactive to light.  Neck: Normal range of motion. Neck supple.  Cardiovascular: Normal rate and regular rhythm.   No murmur heard. Pulmonary/Chest: Effort normal and breath sounds normal. No respiratory distress. He has no wheezes. He has no rales. He exhibits no tenderness.  Psychiatric: He has a normal mood and affect. His behavior is normal. Judgment and thought content normal.          Assessment & Plan:

## 2012-01-05 NOTE — Assessment & Plan Note (Signed)
Will Rx with keflex.  Pt instructed to call if symptoms worsen or if no improvement in 2-3 days.

## 2012-03-17 ENCOUNTER — Other Ambulatory Visit: Payer: Self-pay | Admitting: Family

## 2012-03-17 NOTE — Telephone Encounter (Signed)
Rx to pharmacy/[facility] 

## 2012-04-26 ENCOUNTER — Other Ambulatory Visit: Payer: Self-pay | Admitting: Family

## 2012-04-26 NOTE — Telephone Encounter (Signed)
Pt was last seen by [place] in August and has no future appts on file. Benicar refill sent today for #30 x 1 refill. When should pt follow up with [place] again?

## 2012-04-26 NOTE — Telephone Encounter (Signed)
Pt is due for follow up please.  

## 2012-04-27 ENCOUNTER — Telehealth: Payer: Self-pay | Admitting: *Deleted

## 2012-04-27 NOTE — Telephone Encounter (Signed)
Please call pt to arrange follow up soon. See documentation below:  O'SULLIVAN,MELISSA S., NP 04/26/2012 4:48 PM Signed  Pt is due for follow up please. Mervin Kung, CMA 04/26/2012 3:22 PM Signed  Pt was last seen by [place] in August and has no future appts on file. Benicar refill sent today for #30 x 1 refill. When should pt follow up with [place] again?

## 2012-04-28 NOTE — Telephone Encounter (Signed)
Left detailed message informing patient that a refill of benicar has been sent to the pharmacy and that patient is due for a follow up visit.

## 2012-07-09 ENCOUNTER — Other Ambulatory Visit: Payer: Self-pay | Admitting: Family

## 2012-07-12 ENCOUNTER — Other Ambulatory Visit: Payer: Self-pay | Admitting: Family

## 2012-07-12 NOTE — Telephone Encounter (Signed)
Rx called to pharmacy voicemail. 

## 2012-07-12 NOTE — Telephone Encounter (Signed)
Informed patient of medication refill and he already has appointment scheduled

## 2012-07-12 NOTE — Telephone Encounter (Signed)
Please let pt know that a 2 week supply of benicar has been sent to his pharmacy and he will need to be seen before further refills can be given. Please arrange appt.

## 2012-07-13 MED ORDER — OLMESARTAN MEDOXOMIL 20 MG PO TABS: ORAL_TABLET | ORAL | Status: DC

## 2012-07-13 NOTE — Addendum Note (Signed)
Addended by: Sandford Craze on: 07/13/2012 12:10 PM   Modules accepted: Orders

## 2012-07-19 ENCOUNTER — Ambulatory Visit (INDEPENDENT_AMBULATORY_CARE_PROVIDER_SITE_OTHER): Payer: 59 | Admitting: Family

## 2012-07-19 ENCOUNTER — Encounter: Payer: Self-pay | Admitting: Family

## 2012-07-19 VITALS — BP 118/78 | HR 96 | Temp 98.2°F | Resp 16 | Ht 71.5 in | Wt 272.1 lb

## 2012-07-19 DIAGNOSIS — R739 Hyperglycemia, unspecified: Secondary | ICD-10-CM

## 2012-07-19 DIAGNOSIS — I10 Essential (primary) hypertension: Secondary | ICD-10-CM

## 2012-07-19 DIAGNOSIS — Q181 Preauricular sinus and cyst: Secondary | ICD-10-CM

## 2012-07-19 DIAGNOSIS — R7309 Other abnormal glucose: Secondary | ICD-10-CM

## 2012-07-19 DIAGNOSIS — H61899 Other specified disorders of external ear, unspecified ear: Secondary | ICD-10-CM

## 2012-07-19 LAB — BASIC METABOLIC PANEL
BUN: 24 mg/dL — ABNORMAL HIGH (ref 6–23)
Chloride: 103 mEq/L (ref 96–112)
Potassium: 4.5 mEq/L (ref 3.5–5.3)
Sodium: 136 mEq/L (ref 135–145)

## 2012-07-19 MED ORDER — HYDROCHLOROTHIAZIDE 25 MG PO TABS: ORAL_TABLET | ORAL | Status: DC

## 2012-07-19 MED ORDER — OLMESARTAN MEDOXOMIL 20 MG PO TABS: ORAL_TABLET | ORAL | Status: DC

## 2012-07-19 NOTE — Progress Notes (Signed)
  Subjective:    Patient ID: Ralph [name], male    DOB: [birth year], 32 y.o.   MRN: [id number]  HPI  Ralph [name] is a 32 yr old male who presents today for follow up.  1) HTN- He is currently maintained on benicar and HCTZ. Denies CP, SOB or swelling.  No problems with his medication.   2) Hyperglycemia- He has hx of mild hyperglycemia.  Last A1C was 5.7.  3) Cyst- right ear x 3 days. Has had this before.    Review of Systems See HPI  Past Medical History  Diagnosis Date  . History of chicken pox   . Elevated blood pressure reading     History   Social History  . Marital Status: Single    Spouse Name: N/A    Number of Children: 2  . Years of Education: N/A   Occupational History  .  [employer]   Social History Main Topics  . Smoking status: Former Smoker -- 6 years    Types: Cigarettes    Quit date: 08/18/2010  . Smokeless tobacco: Never Used  . Alcohol Use: Yes     Comment: 1-2 drinks liquor a month  . Drug Use: No  . Sexually Active: Not on file   Other Topics Concern  . Not on file   Social History Narrative   Regular exercise:  5 x weekly   Caffeine Use:  1 every other day.   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    Denies drug use.      Past Surgical History  Procedure Laterality Date  . 1988      Family History  Problem Relation Age of Onset  . Diabetes Father   . Hypertension Maternal Grandfather   . Diabetes Paternal Grandmother   . Cancer Neg Hx   . Heart disease Neg Hx     No Known Allergies  Current Outpatient Prescriptions on File Prior to Visit  Medication Sig Dispense Refill  . hydrochlorothiazide (HYDRODIURIL) 25 MG tablet TAKE ONE TABLET BY MOUTH EVERY DAY  30 tablet  3  . olmesartan (BENICAR) 20 MG tablet TAKE ONE TABLET BY MOUTH EVERY DAY  30 tablet  0  . meloxicam (MOBIC) 7.5 MG tablet Take 1 tablet (7.5 mg total) by mouth daily.  10 tablet  0  . Omega-3 Fatty  Acids (FISH OIL) 1200 MG CAPS Take 1 capsule by mouth daily.      . Tamsulosin HCl (FLOMAX) 0.4 MG CAPS Take 1 capsule (0.4 mg total) by mouth daily.  15 capsule  0   No current facility-administered medications on file prior to visit.    BP 118/78  Pulse 96  Temp(Src) 98.2 F (36.8 C) (Oral)  Resp 16  Ht 5' 11.5" (1.816 m)  Wt 272 lb 1.3 oz (123.415 kg)  BMI 37.42 kg/m2  SpO2 97%       Objective:   Physical Exam  Constitutional: He appears well-developed and well-nourished. No distress.  Cardiovascular: Normal rate and regular rhythm.   No murmur heard. Pulmonary/Chest: Effort normal and breath sounds normal. No respiratory distress. He has no wheezes. He has no rales. He exhibits no tenderness.  Musculoskeletal: He exhibits no edema.  skin: small pimple- like cyst in the right ear canal        Assessment & Plan:

## 2012-07-19 NOTE — Assessment & Plan Note (Addendum)
Stable on hctz and benicar. Continue the same.  Obtain BMET.

## 2012-07-19 NOTE — Assessment & Plan Note (Signed)
Obtain a1c.  

## 2012-07-19 NOTE — Patient Instructions (Addendum)
Please complete your blood work prior to leaving.  Please schedule a follow up appointment in 6 months.

## 2012-07-19 NOTE — Assessment & Plan Note (Signed)
Small, should resolve on own. Asked pt to call if cyst enlarges or if it does not improve.

## 2012-07-21 ENCOUNTER — Encounter: Payer: Self-pay | Admitting: Family

## 2013-01-24 ENCOUNTER — Encounter: Payer: Self-pay | Admitting: Family

## 2013-01-24 ENCOUNTER — Ambulatory Visit (INDEPENDENT_AMBULATORY_CARE_PROVIDER_SITE_OTHER): Payer: 59 | Admitting: Family

## 2013-01-24 VITALS — BP 118/90 | HR 76 | Temp 98.1°F | Resp 16 | Ht 71.5 in | Wt 283.0 lb

## 2013-01-24 DIAGNOSIS — I10 Essential (primary) hypertension: Secondary | ICD-10-CM

## 2013-01-24 MED ORDER — HYDROCHLOROTHIAZIDE 25 MG PO TABS: ORAL_TABLET | ORAL | Status: DC

## 2013-01-24 MED ORDER — OLMESARTAN MEDOXOMIL 20 MG PO TABS: ORAL_TABLET | ORAL | Status: DC

## 2013-01-24 NOTE — Patient Instructions (Addendum)
Pleas complete lab work prior to leaving. Follow up in 6 months for a fasting physical.

## 2013-01-24 NOTE — Assessment & Plan Note (Signed)
Stable, continue current meds, obtain bmet.

## 2013-01-24 NOTE — Progress Notes (Signed)
  Subjective:    Patient ID: Ralph [name], male    DOB: 28-Feb-1981, 32 y.o.   MRN: [id number]  HPI  Ralph [name] is a 31 yr old male who presents today for follow up .  1) HTN- he reports good compliance with his medications.  Denies CP, SOB or swelling.  No new concerns.  Declines flu shot.     Review of Systems See HPI  Past Medical History  Diagnosis Date  . History of chicken pox   . Elevated blood pressure reading     History   Social History  . Marital Status: Single    Spouse Name: N/A    Number of Children: 2  . Years of Education: N/A   Occupational History  .  [employer]   Social History Main Topics  . Smoking status: Former Smoker -- 6 years    Types: Cigarettes    Quit date: 08/18/2010  . Smokeless tobacco: Never Used  . Alcohol Use: Yes     Comment: 1-2 drinks liquor a month  . Drug Use: No  . Sexual Activity: Not on file   Other Topics Concern  . Not on file   Social History Narrative   Regular exercise:  5 x weekly   Caffeine Use:  1 every other day.   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    Denies drug use.      Past Surgical History  Procedure Laterality Date  . 1988      Family History  Problem Relation Age of Onset  . Diabetes Father   . Hypertension Maternal Grandfather   . Diabetes Paternal Grandmother   . Cancer Neg Hx   . Heart disease Neg Hx     No Known Allergies  No current outpatient prescriptions on file prior to visit.   No current facility-administered medications on file prior to visit.    BP 118/90  Pulse 76  Temp(Src) 98.1 F (36.7 C) (Oral)  Resp 16  Ht 5' 11.5" (1.816 m)  Wt 283 lb (128.368 kg)  BMI 38.92 kg/m2  SpO2 99%       Objective:   Physical Exam  Constitutional: He is oriented to person, place, and time. He appears well-developed and well-nourished. No distress.  HENT:  Head: Normocephalic and atraumatic.   Cardiovascular: Normal rate and regular rhythm.   No murmur heard. Pulmonary/Chest: Effort normal and breath sounds normal. No respiratory distress. He has no wheezes. He has no rales. He exhibits no tenderness.  Musculoskeletal: He exhibits no edema.  Neurological: He is alert and oriented to person, place, and time.  Psychiatric: He has a normal mood and affect. His behavior is normal. Judgment and thought content normal.          Assessment & Plan:

## 2013-01-25 ENCOUNTER — Encounter: Payer: Self-pay | Admitting: Family

## 2013-01-25 LAB — BASIC METABOLIC PANEL
CO2: 28 mEq/L (ref 19–32)
Calcium: 10 mg/dL (ref 8.4–10.5)
Glucose, Bld: 100 mg/dL — ABNORMAL HIGH (ref 70–99)
Sodium: 136 mEq/L (ref 135–145)

## 2013-01-25 MED ORDER — OLMESARTAN MEDOXOMIL 20 MG PO TABS: ORAL_TABLET | ORAL | Status: DC

## 2013-01-25 NOTE — Addendum Note (Signed)
Addended by: Sandford Craze on: 01/25/2013 04:45 PM   Modules accepted: Orders

## 2013-03-21 ENCOUNTER — Telehealth: Payer: Self-pay | Admitting: *Deleted

## 2013-03-21 NOTE — Telephone Encounter (Signed)
Received message from pt stating pharmacy only gave him 2 week supply of his medication and he was wondering why.  Pt did not state name of medication needed. Left message on pt's voicemail that upon review of EPIC benicar and hctz were refilled on 01/24/13 and to return my call.  Called pharmacy to verify they received previous refill and was told that benicar was last received in March and refills had expired. Gave verbal for #30 x 3 refills.

## 2013-08-08 ENCOUNTER — Telehealth: Payer: Self-pay | Admitting: *Deleted

## 2013-08-08 MED ORDER — OLMESARTAN MEDOXOMIL 20 MG PO TABS: ORAL_TABLET | ORAL | Status: DC

## 2013-08-08 MED ORDER — HYDROCHLOROTHIAZIDE 25 MG PO TABS: ORAL_TABLET | ORAL | Status: DC

## 2013-08-08 NOTE — Telephone Encounter (Signed)
Patient has scheduled new patient appointment with Dr. Drue NovelPaz for 09/12/13

## 2013-08-08 NOTE — Telephone Encounter (Signed)
Received message from pt that he has been trying to refill his medication since Monday and pharmacy told him to call us as his refills had expired.  30 days supply x 1 refill each sent to pharmacy. Pt is due for follow up this month and has no appt on file.  Please call pt to arrange f/u.

## 2013-08-08 NOTE — Telephone Encounter (Signed)
Noted  

## 2013-08-29 ENCOUNTER — Ambulatory Visit: Payer: 59 | Admitting: Family

## 2013-09-09 ENCOUNTER — Telehealth: Payer: Self-pay

## 2013-09-09 NOTE — Telephone Encounter (Signed)
Medication List and allergies:  Reviewed and updated  90 day supply/mail order: n Local prescriptions: [facility] on [address]  Immunizations due: UTD  A/P:   No changes to FH, PSH or Personal Hx Flu vaccine--not this past season Tdap--10/2010 PSA--none noted  To Discuss with Provider: Not at this time

## 2013-09-12 ENCOUNTER — Ambulatory Visit (INDEPENDENT_AMBULATORY_CARE_PROVIDER_SITE_OTHER): Payer: 59 | Admitting: Internal Medicine

## 2013-09-12 ENCOUNTER — Encounter: Payer: Self-pay | Admitting: Internal Medicine

## 2013-09-12 VITALS — BP 127/87 | HR 77 | Temp 98.7°F | Ht 72.0 in | Wt 285.0 lb

## 2013-09-12 DIAGNOSIS — R9431 Abnormal electrocardiogram [ECG] [EKG]: Secondary | ICD-10-CM

## 2013-09-12 DIAGNOSIS — R739 Hyperglycemia, unspecified: Secondary | ICD-10-CM

## 2013-09-12 DIAGNOSIS — I10 Essential (primary) hypertension: Secondary | ICD-10-CM

## 2013-09-12 DIAGNOSIS — E663 Overweight: Secondary | ICD-10-CM

## 2013-09-12 DIAGNOSIS — R7309 Other abnormal glucose: Secondary | ICD-10-CM

## 2013-09-12 LAB — CBC WITH DIFFERENTIAL/PLATELET
BASOS ABS: 0 10*3/uL (ref 0.0–0.1)
Basophils Relative: 0.6 % (ref 0.0–3.0)
EOS ABS: 0.1 10*3/uL (ref 0.0–0.7)
Eosinophils Relative: 1.8 % (ref 0.0–5.0)
HCT: 46.7 % (ref 39.0–52.0)
Hemoglobin: 15.4 g/dL (ref 13.0–17.0)
LYMPHS PCT: 27 % (ref 12.0–46.0)
Lymphs Abs: 1.9 10*3/uL (ref 0.7–4.0)
MCHC: 32.9 g/dL (ref 30.0–36.0)
MCV: 81.4 fl (ref 78.0–100.0)
MONO ABS: 0.5 10*3/uL (ref 0.1–1.0)
Monocytes Relative: 7.6 % (ref 3.0–12.0)
NEUTROS PCT: 63 % (ref 43.0–77.0)
Neutro Abs: 4.5 10*3/uL (ref 1.4–7.7)
PLATELETS: 319 10*3/uL (ref 150.0–400.0)
RBC: 5.74 Mil/uL (ref 4.22–5.81)
RDW: 14.8 % — AB (ref 11.5–14.6)
WBC: 7.1 10*3/uL (ref 4.5–10.5)

## 2013-09-12 LAB — COMPREHENSIVE METABOLIC PANEL
ALBUMIN: 4.3 g/dL (ref 3.5–5.2)
ALK PHOS: 48 U/L (ref 39–117)
ALT: 39 U/L (ref 0–53)
AST: 23 U/L (ref 0–37)
BUN: 13 mg/dL (ref 6–23)
CALCIUM: 9.6 mg/dL (ref 8.4–10.5)
CHLORIDE: 102 meq/L (ref 96–112)
CO2: 26 mEq/L (ref 19–32)
Creatinine, Ser: 1.3 mg/dL (ref 0.4–1.5)
GFR: 82.86 mL/min (ref >60.00)
Glucose, Bld: 69 mg/dL — ABNORMAL LOW (ref 70–99)
POTASSIUM: 4 meq/L (ref 3.5–5.1)
SODIUM: 137 meq/L (ref 135–145)
TOTAL PROTEIN: 7.8 g/dL (ref 6.0–8.3)
Total Bilirubin: 0.3 mg/dL (ref 0.3–1.2)

## 2013-09-12 LAB — HEMOGLOBIN A1C: Hgb A1c MFr Bld: 5.7 % (ref 4.6–6.5)

## 2013-09-12 NOTE — Assessment & Plan Note (Signed)
Check the A1c 

## 2013-09-12 NOTE — Assessment & Plan Note (Signed)
Chart reviewed, has gained 25 pounds per our scales since 2013. He goes to the gym almost daily. Most  likely  his problem is excessive eating, several strategies to eat healthier discussed, recommend calorie counting -self education ---> see instructions. Declined at this time a nutritionist referral.

## 2013-09-12 NOTE — Progress Notes (Signed)
   Subjective:    Patient ID: Ralph [name], male    DOB: 21-May-1980, 33 y.o.   MRN: [id number]  DOS:  09/12/2013 Type of  visit: First visit with me, previous visit notes and labs reviewed. Hypertension, good compliance of medication, no ambulatory BPs. BP today is very good H/o pre diabetes, he exercise regularly, diet needs improvement Overweight, significant weight gain in the last 2 years, needs some help.   ROS Denies chest pain, difficulty breathing or lower extremity edema No nausea, vomiting, diarrhea or blood in the stools.  Past Medical History  Diagnosis Date  . Hyperglycemia 01/30/2011  . HTN (hypertension) 11/11/2010  . Kidney stone 09/29/2011  . Abnormal EKG 11/11/2010    Past Surgical History  Procedure Laterality Date  . Neck surgery      cyst?    History   Social History  . Marital Status: Single    Spouse Name: N/A    Number of Children: 2  . Years of Education: N/A   Occupational History  . 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 Smoking status: Former Smoker -- 6 years    Types: Cigarettes    Quit date: 08/18/2010  . Smokeless tobacco: Never Used  . Alcohol Use: Yes     Comment: 1-2 drinks liquor a month  . Drug Use: No  . Sexual Activity: Not on file   Other Topics Concern  . Not on file   Social History Narrative   Works as a Estate agenttruck driver   Single- 2 children born 2001 daughter and 2007 son, they live w/ pt             Medication List       This list is accurate as of: 09/12/13  6:18 PM.  Always use your most recent med list.               hydrochlorothiazide 25 MG tablet  Commonly known as:  HYDRODIURIL  TAKE ONE TABLET BY MOUTH EVERY DAY     olmesartan 20 MG tablet  Commonly known as:  BENICAR  TAKE ONE TABLET BY MOUTH EVERY DAY           Objective:   Physical Exam BP 127/87  Pulse 77  Temp(Src) 98.7 F (37.1 C)  Ht 6' (1.829 m)  Wt 285 lb (129.275 kg)  BMI 38.64 kg/m2  SpO2 96% General  -- alert, well-developed, NAD.  Neck --no thyromegaly , normal carotid pulse HEENT-- Not pale.   Lungs -- normal respiratory effort, no intercostal retractions, no accessory muscle use, and normal breath sounds.  Heart-- normal rate, regular rhythm, no murmur.  Extremities-- no pretibial edema bilaterally  Neurologic--  alert & oriented X3. Speech normal, gait normal, strength normal in all extremities.  Psych-- Cognition and judgment appear intact. Cooperative with normal attention span and concentration. No anxious or depressed appearing.     Assessment & Plan:

## 2013-09-12 NOTE — Patient Instructions (Signed)
Get your blood work before you leave    Consider use  MYFITNESSPALL, an app to track your calorie intake  If you need more information about a healthy diet  visit  the American Heart Association, it  is a great resource online at:  Mormon101.plHttp://www.heart.org/HEARTORG/  Next visit is for routine check up regards your weighjt, blood pressure   in 4 months , fasting Please make an appointment

## 2013-09-12 NOTE — Progress Notes (Signed)
Pre visit review using our clinic review tool, if applicable. No additional management support is needed unless otherwise documented below in the visit note. 

## 2013-09-12 NOTE — Assessment & Plan Note (Signed)
Well-controlled today, no ambulatory BPs. Plan: Continue with present care, Labs

## 2013-09-12 NOTE — Assessment & Plan Note (Signed)
Chart reviewed, in 2012, EKGs showed Sinus rhythm. T wave inversion II, III, AVF, V4 to V6. Saw cardiology, echocardiogram essentially negative, was recommended but BP control

## 2013-09-13 ENCOUNTER — Telehealth: Payer: Self-pay | Admitting: Internal Medicine

## 2013-09-13 NOTE — Telephone Encounter (Signed)
Relevant patient education assigned to patient using Emmi. ° °

## 2013-09-15 ENCOUNTER — Encounter: Payer: Self-pay | Admitting: *Deleted

## 2013-10-18 ENCOUNTER — Telehealth: Payer: Self-pay | Admitting: Internal Medicine

## 2013-10-18 DIAGNOSIS — I10 Essential (primary) hypertension: Secondary | ICD-10-CM

## 2013-10-18 NOTE — Telephone Encounter (Signed)
Caller name:Ralph [name] Relation to pt: patient Call back number:[phone] Pharmacy:[facility]   Reason for call: to request refills for benicar and hydrochlorothiazide

## 2013-10-19 MED ORDER — HYDROCHLOROTHIAZIDE 25 MG PO TABS: ORAL_TABLET | ORAL | Status: DC

## 2013-10-19 MED ORDER — OLMESARTAN MEDOXOMIL 20 MG PO TABS
ORAL_TABLET | ORAL | Status: DC
Start: 2013-10-19 — End: 2014-05-08

## 2013-10-19 NOTE — Telephone Encounter (Signed)
Refills for Benicar and HCTZ sent to [facility] on [address]

## 2013-11-30 ENCOUNTER — Encounter: Payer: Self-pay | Admitting: Internal Medicine

## 2013-11-30 ENCOUNTER — Ambulatory Visit (INDEPENDENT_AMBULATORY_CARE_PROVIDER_SITE_OTHER): Payer: 59 | Admitting: Internal Medicine

## 2013-11-30 ENCOUNTER — Telehealth: Payer: Self-pay | Admitting: Internal Medicine

## 2013-11-30 VITALS — BP 118/73 | HR 100 | Temp 98.3°F | Wt 287.0 lb

## 2013-11-30 DIAGNOSIS — L03116 Cellulitis of left lower limb: Secondary | ICD-10-CM

## 2013-11-30 DIAGNOSIS — L03119 Cellulitis of unspecified part of limb: Secondary | ICD-10-CM

## 2013-11-30 DIAGNOSIS — L02419 Cutaneous abscess of limb, unspecified: Secondary | ICD-10-CM

## 2013-11-30 MED ORDER — DOXYCYCLINE HYCLATE 100 MG PO TABS: 100.0000 mg | ORAL_TABLET | Freq: Two times a day (BID) | ORAL | Status: DC

## 2013-11-30 NOTE — Patient Instructions (Signed)
Take the  antibiotics as prescribed You should be gradually getting better, if the area gets larger, you have fever, chills or you are not improving in the next few days: please call us

## 2013-11-30 NOTE — Progress Notes (Signed)
Pre visit review using our clinic review tool, if applicable. No additional management support is needed unless otherwise documented below in the visit note. 

## 2013-11-30 NOTE — Progress Notes (Signed)
   Subjective:    Patient ID: Ralph [name], male    DOB: 17-Nov-1980, 33 y.o.   MRN: [id number]  DOS:  11/30/2013 Type of visit - description: acute History: Woke up yesterday With pain and swelling in the left inner leg . The area is getting slightly larger, he saw some discharge yesterday (pus and blood). Not sure if something bite him  there.   ROS Denies fever or chills No other lesions No involvement of the GU area   Past Medical History  Diagnosis Date  . Hyperglycemia 01/30/2011  . HTN (hypertension) 11/11/2010  . Kidney stone 09/29/2011  . Abnormal EKG 11/11/2010    Past Surgical History  Procedure Laterality Date  . Neck surgery      cyst?    History   Social History  . Marital Status: Single    Spouse Name: N/A    Number of Children: 2  . Years of Education: N/A   Occupational History  . 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 Smoking status: Former Smoker -- 6 years    Types: Cigarettes    Quit date: 08/18/2010  . Smokeless tobacco: Never Used  . Alcohol Use: Yes     Comment: 1-2 drinks liquor a month  . Drug Use: No  . Sexual Activity: Not on file   Other Topics Concern  . Not on file   Social History Narrative   Works as a Estate agenttruck driver   Single- 2 children born 2001 daughter and 2007 son, they live w/ pt             Medication List       This list is accurate as of: 11/30/13 10:34 PM.  Always use your most recent med list.               doxycycline 100 MG tablet  Commonly known as:  VIBRA-TABS  Take 1 tablet (100 mg total) by mouth 2 (two) times daily.     hydrochlorothiazide 25 MG tablet  Commonly known as:  HYDRODIURIL  TAKE ONE TABLET BY MOUTH EVERY DAY     olmesartan 20 MG tablet  Commonly known as:  BENICAR  TAKE ONE TABLET BY MOUTH EVERY DAY           Objective:   Physical Exam  Constitutional: He is oriented to person, place, and time. He appears well-developed.  Neurological: He is oriented to  person, place, and time.  Skin:     Psychiatric: He has a normal mood and affect. His behavior is normal. Judgment and thought content normal.   BP 118/73  Pulse 100  Temp(Src) 98.3 F (36.8 C)  Wt 287 lb (130.182 kg)  SpO2 94%       Assessment & Plan:   Cellulitis, left leg. Plan:  Doxycycline To call if not improving. It  may come to a head, he may need to return for a I&D

## 2013-11-30 NOTE — Telephone Encounter (Signed)
A user error has taken place.

## 2014-01-16 ENCOUNTER — Ambulatory Visit (INDEPENDENT_AMBULATORY_CARE_PROVIDER_SITE_OTHER): Payer: 59 | Admitting: Internal Medicine

## 2014-01-16 ENCOUNTER — Encounter: Payer: Self-pay | Admitting: Internal Medicine

## 2014-01-16 VITALS — BP 138/84 | HR 93 | Temp 98.8°F | Wt 287.7 lb

## 2014-01-16 DIAGNOSIS — I10 Essential (primary) hypertension: Secondary | ICD-10-CM

## 2014-01-16 DIAGNOSIS — E663 Overweight: Secondary | ICD-10-CM

## 2014-01-16 DIAGNOSIS — R7309 Other abnormal glucose: Secondary | ICD-10-CM

## 2014-01-16 DIAGNOSIS — R739 Hyperglycemia, unspecified: Secondary | ICD-10-CM

## 2014-01-16 LAB — LIPID PANEL
CHOL/HDL RATIO: 5
Cholesterol: 200 mg/dL (ref 0–200)
HDL: 43 mg/dL (ref >39.00)
LDL Cholesterol: 122 mg/dL — ABNORMAL HIGH (ref 0–99)
NonHDL: 157
Triglycerides: 177 mg/dL — ABNORMAL HIGH (ref 0.0–149.0)
VLDL: 35.4 mg/dL (ref 0.0–40.0)

## 2014-01-16 LAB — HEMOGLOBIN A1C: Hgb A1c MFr Bld: 5.8 % (ref 4.6–6.5)

## 2014-01-16 NOTE — Assessment & Plan Note (Addendum)
Good med compliance, no recent ambulatory BPs. No change for now, reassess in 4 months Check FLP

## 2014-01-16 NOTE — Progress Notes (Signed)
Pre visit review using our clinic review tool, if applicable. No additional management support is needed unless otherwise documented below in the visit note. 

## 2014-01-16 NOTE — Progress Notes (Signed)
   Subjective:    Patient ID: Ralph [name], male    DOB: [birth year], 32 y.o.   MRN: [id number]  DOS:  01/16/2014 Type of visit - description : f/u Interval history: Since the last time, he is trying to "cut back"  , is "avoiding" pork-beef. For a while was unable to exercise but is getting back on rhytm. Good medication compliance, no apparent side effects, no ambulatory BPs     Past Medical History  Diagnosis Date  . Hyperglycemia 01/30/2011  . HTN (hypertension) 11/11/2010  . Kidney stone 09/29/2011  . Abnormal EKG 11/11/2010    Past Surgical History  Procedure Laterality Date  . Neck surgery      cyst?    History   Social History  . Marital Status: Single    Spouse Name: N/A    Number of Children: 2  . Years of Education: N/A   Occupational History  . 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 Smoking status: Former Smoker -- 6 years    Types: Cigarettes    Quit date: 08/18/2010  . Smokeless tobacco: Never Used  . Alcohol Use: Yes     Comment: 1-2 drinks liquor a month  . Drug Use: No  . Sexual Activity: Not on file   Other Topics Concern  . 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  Always use your most recent med list.               hydrochlorothiazide 25 MG tablet  Commonly known as:  HYDRODIURIL  TAKE ONE TABLET BY MOUTH EVERY DAY     olmesartan 20 MG tablet  Commonly known as:  BENICAR  TAKE ONE TABLET BY MOUTH EVERY DAY           Objective:   Physical Exam BP 138/84  Pulse 93  Temp(Src) 98.8 F (37.1 C) (Oral)  Wt 287 lb 11.2 oz (130.5 kg)  SpO2 99%  General -- alert, well-developed, NAD.  Extremities-- no pretibial edema bilaterally  Neurologic--  alert & oriented X3. Speech normal, gait appropriate for age, strength symmetric and  appropriate for age.  Psych-- Cognition and judgment appear intact. Cooperative with normal attention span and concentration. No anxious or depressed appearing.       Assessment & Plan:   Today , I spent more than 19   min with the patient: >50% of the time counseling regards diet, see a/p

## 2014-01-16 NOTE — Assessment & Plan Note (Signed)
Since the last visit, he has not done much of self learning consequently today we spent the majority of this 20 minutes visit talking  about diet, portion control, minimize carbohydrates, increase salads and fruits, etc

## 2014-01-16 NOTE — Patient Instructions (Signed)
Get your blood work before you leave   Next visit is for a physical exam in 4 months, fasting Please make an appointment    

## 2014-01-16 NOTE — Assessment & Plan Note (Signed)
Concept of  hemoglobin A1c discussed

## 2014-05-08 ENCOUNTER — Ambulatory Visit (INDEPENDENT_AMBULATORY_CARE_PROVIDER_SITE_OTHER): Payer: 59 | Admitting: Internal Medicine

## 2014-05-08 ENCOUNTER — Encounter: Payer: Self-pay | Admitting: Internal Medicine

## 2014-05-08 VITALS — BP 124/85 | HR 95 | Temp 97.9°F | Ht 72.0 in | Wt 289.5 lb

## 2014-05-08 DIAGNOSIS — Z0000 Encounter for general adult medical examination without abnormal findings: Secondary | ICD-10-CM

## 2014-05-08 DIAGNOSIS — I10 Essential (primary) hypertension: Secondary | ICD-10-CM

## 2014-05-08 LAB — BASIC METABOLIC PANEL
BUN: 13 mg/dL (ref 6–23)
CO2: 25 mEq/L (ref 19–32)
Calcium: 9.5 mg/dL (ref 8.4–10.5)
Chloride: 100 mEq/L (ref 96–112)
Creatinine, Ser: 1.1 mg/dL (ref 0.4–1.5)
GFR: 97.29 mL/min (ref >60.00)
Glucose, Bld: 95 mg/dL (ref 70–99)
Potassium: 3.7 mEq/L (ref 3.5–5.1)
Sodium: 135 mEq/L (ref 135–145)

## 2014-05-08 MED ORDER — HYDROCHLOROTHIAZIDE 25 MG PO TABS: ORAL_TABLET | ORAL | Status: DC

## 2014-05-08 MED ORDER — OLMESARTAN MEDOXOMIL 20 MG PO TABS: ORAL_TABLET | ORAL | Status: DC

## 2014-05-08 NOTE — Progress Notes (Signed)
Pre visit review using our clinic review tool, if applicable. No additional management support is needed unless otherwise documented below in the visit note. 

## 2014-05-08 NOTE — Assessment & Plan Note (Signed)
Td 2012, had a flu shot Labs reviewed, due for a BMP and a TSH Diet exercise extensively discussed. Counseled about STE  Return to clinic in 6 months for BP and   prediabetes check

## 2014-05-08 NOTE — Patient Instructions (Signed)
Get your blood work before you leave   Exercise 3 hours a week  If you need more information about a healthy diet,  visit  the American Heart Association, it  is a Radiographer, therapeuticgreat resource online at:  Mormon101.plHttp://www.heart.org/HEARTORG/   Check the  blood pressure  Monthly Be sure your blood pressure is between  145/85  and 110/65.  if it is consistently higher or lower, let me know      Please come back to the office in 6 months  for a routine check up  Come back fasting       Testicular Self-Exam A self-examination of your testicles involves looking at and feeling your testicles for abnormal lumps or swelling. Several things can cause swelling, lumps, or pain in your testicles. Some of these causes are:  Injuries.  Inflammation.  Infection.  Accumulation of fluids around your testicle (hydrocele).  Twisted testicles (testicular torsion).  Testicular cancer. Self-examination of the testicles and groin areas may be advised if you are at risk for testicular cancer. Risks for testicular cancer include:  An undescended testicle (cryptorchidism).  A history of previous testicular cancer.  A family history of testicular cancer. The testicles are easiest to examine after warm baths or showers and are more difficult to examine when you are cold. This is because the muscles attached to the testicles retract and pull them up higher or into the abdomen. Follow these steps while you are standing:  Hold your penis away from your body.  Roll one testicle between your thumb and forefinger, feeling the entire testicle.  Roll the other testicle between your thumb and forefinger, feeling the entire testicle. Feel for lumps, swelling, or discomfort. A normal testicle is egg shaped and feels firm. It is smooth and not tender. The spermatic cord can be felt as a firm spaghetti-like cord at the back of your testicle. It is also important to examine the crease between the front of your leg and your  abdomen. Feel for any bumps that are tender. These could be enlarged lymph nodes.  Document Released: 08/11/2000 Document Revised: 01/05/2013 Document Reviewed: 10/25/2012 [facility] Patient Information 2015 [place], [place]. This information is not intended to replace advice given to you by your health care provider. Make sure you discuss any questions you have with your health care provider.

## 2014-05-08 NOTE — Progress Notes (Signed)
   Subjective:    Patient ID: Ralph [name], male    DOB: [birth year], 33 y.o.   MRN: [id number]  DOS:  05/08/2014 Type of visit - description : CPX Interval history: No major concerns, needs to improve his diet and exercise   ROS Denies chest pain or difficulty breathing No nausea, vomiting, diarrhea. Occasionally has heartburn depending on what he eats. Denies cough, sputum production or wheezing No anxiety or depression No dysuria, gross hematuria or difficulty urinating  Past Medical History  Diagnosis Date  . Hyperglycemia 01/30/2011  . HTN (hypertension) 11/11/2010  . Kidney stone 09/29/2011  . Abnormal EKG 11/11/2010    Past Surgical History  Procedure Laterality Date  . Neck surgery      cyst?    History   Social History  . Marital Status: Single    Spouse Name: N/A    Number of Children: 2  . Years of Education: N/A   Occupational History  . 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 Smoking status: Former Smoker -- 6 years    Types: Cigarettes    Quit date: 08/18/2010  . Smokeless tobacco: Never Used  . Alcohol Use: Yes     Comment: 1-2 drinks liquor a month  . Drug Use: No  . Sexual Activity: Not on file   Other Topics Concern  . Not on file   Social History Narrative   Works as a Estate agenttruck driver   Single- 2 children born 2001 daughter and 2007 son, they live w/ pt          Family History  Problem Relation Age of Onset  . Diabetes Father   . Hypertension Maternal Grandfather   . Diabetes Paternal Grandmother   . Cancer Neg Hx   . Heart disease Neg Hx        Medication List       This list is accurate as of: 05/08/14  6:13 PM.  Always use your most recent med list.               hydrochlorothiazide 25 MG tablet  Commonly known as:  HYDRODIURIL  TAKE ONE TABLET BY MOUTH EVERY DAY     olmesartan 20 MG tablet  Commonly known as:  BENICAR  TAKE ONE TABLET BY MOUTH EVERY DAY           Objective:   Physical  Exam BP 124/85 mmHg  Pulse 95  Temp(Src) 97.9 F (36.6 C) (Oral)  Ht 6' (1.829 m)  Wt 289 lb 8 oz (131.316 kg)  BMI 39.25 kg/m2  SpO2 95% General -- alert, well-developed, NAD.  Neck --no thyromegaly , normal carotid pulse  HEENT-- Not pale.   Lungs -- normal respiratory effort, no intercostal retractions, no accessory muscle use, and normal breath sounds.  Heart-- normal rate, regular rhythm, no murmur.  Abdomen-- Not distended, good bowel sounds,soft, non-tender. No rebound or rigidity.  Extremities-- no pretibial edema bilaterally  Neurologic--  alert & oriented X3. Speech normal, gait appropriate for age, strength symmetric and appropriate for age.  Psych-- Cognition and judgment appear intact. Cooperative with normal attention span and concentration. No anxious or depressed appearing.      Assessment & Plan:

## 2014-05-09 LAB — TSH: TSH: 1.23 u[IU]/mL (ref 0.35–4.50)

## 2014-11-06 ENCOUNTER — Ambulatory Visit (INDEPENDENT_AMBULATORY_CARE_PROVIDER_SITE_OTHER): Payer: 59 | Admitting: Internal Medicine

## 2014-11-06 ENCOUNTER — Encounter: Payer: Self-pay | Admitting: Internal Medicine

## 2014-11-06 VITALS — BP 124/78 | HR 78 | Temp 97.9°F | Ht 72.0 in | Wt 285.1 lb

## 2014-11-06 DIAGNOSIS — I10 Essential (primary) hypertension: Secondary | ICD-10-CM | POA: Diagnosis not present

## 2014-11-06 DIAGNOSIS — R7303 Prediabetes: Secondary | ICD-10-CM

## 2014-11-06 DIAGNOSIS — R739 Hyperglycemia, unspecified: Secondary | ICD-10-CM

## 2014-11-06 DIAGNOSIS — R7309 Other abnormal glucose: Secondary | ICD-10-CM

## 2014-11-06 LAB — BASIC METABOLIC PANEL
BUN: 17 mg/dL (ref 6–23)
CO2: 27 meq/L (ref 19–32)
Calcium: 9.8 mg/dL (ref 8.4–10.5)
Chloride: 100 mEq/L (ref 96–112)
Creatinine, Ser: 1.2 mg/dL (ref 0.40–1.50)
GFR: 88.66 mL/min (ref >60.00)
Glucose, Bld: 97 mg/dL (ref 70–99)
POTASSIUM: 3.4 meq/L — AB (ref 3.5–5.1)
SODIUM: 134 meq/L — AB (ref 135–145)

## 2014-11-06 LAB — HEMOGLOBIN A1C: Hgb A1c MFr Bld: 5.5 % (ref 4.6–6.5)

## 2014-11-06 NOTE — Assessment & Plan Note (Signed)
  BP well-controlled on HCTZ and olmesartan, today BP 126/78.  Doing well with exercise, not doing well with diet.  Plan: Patient counseled on importance of improving diet, pt provided resources (AHA, [place]) on healthy diet. No changes to meds. Will check BMP today.

## 2014-11-06 NOTE — Assessment & Plan Note (Signed)
Previous A1c 5.8%, patient notes he has increased exercise in addition to his active job. Notes his diet is not good and he knows it could be more improved.  Patient denies any changes to vision and any numbness/tingling.  Plan:  Patient counseled on importance of healthy diet. Patient provided resources on healthy diet (AHA, [place]).  Will check A1c today.

## 2014-11-06 NOTE — Progress Notes (Signed)
Subjective:    Patient ID: Ralph [name], male    DOB: 1980/12/25, 34 y.o.   MRN: [id number]  DOS:  11/06/2014 Type of visit - description : ROV Interval history:  Patient is a 34 year old male with a history of HTN in today for a routine follow-up.  HTN: Patient notes good compliance with HCTZ and olmesartan, denies any side effects except for mildly increased urinary frequency. Patient notes he is exercising regularly by walking and going to the gym, but notes that his diet could be improved. 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 Denies chest pain and SOB.  Pre-diabetes: Previous A1c was 5.8, patient has been regularly exercising by walking and going to the gym. Has not improved diet. Patient denies any blurry vision. Denies numbness/tingling in extremities.    Review of Systems  Constitutional: No fever. No chills.   Respiratory: No wheezing , no  difficulty breathing.   Cardiovascular: No CP, no leg swelling  GI: no nausea, no vomiting, no diarrhea , no  abdominal pain.  No blood in the stools.   GU: No dysuria, gross hematuria, difficulty urinating. No urinary urgency.   Neurological: No dizziness or headaches. No diplopia.    Past Medical History  Diagnosis Date  . Hyperglycemia 01/30/2011  . HTN (hypertension) 11/11/2010  . Kidney stone 09/29/2011  . Abnormal EKG 11/11/2010    Past Surgical History  Procedure Laterality Date  . Neck surgery      cyst?    History   Social History  . Marital Status: Single    Spouse Name: N/A  . Number of Children: 2  . Years of Education: N/A   Occupational History  . 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 Smoking status: Former Smoker -- 6 years    Types: Cigarettes    Quit date: 08/18/2010  . Smokeless tobacco: Never Used  . Alcohol Use: Yes     Comment: 1-2 drinks liquor a month  . Drug Use: No  . Sexual Activity: Not on file   Other Topics  Concern  . 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 Diabetes Father   . Hypertension Maternal Grandfather   . Diabetes Paternal Grandmother   . Cancer Neg Hx   . Heart disease Neg Hx       Medication List       This list is accurate as of: 11/06/14  1:58 PM.  Always use your most recent med list.               hydrochlorothiazide 25 MG tablet  Commonly known as:  HYDRODIURIL  TAKE ONE TABLET BY MOUTH EVERY DAY     olmesartan 20 MG tablet  Commonly known as:  BENICAR  TAKE ONE TABLET BY MOUTH EVERY DAY           Objective:   Physical Exam BP 124/78 mmHg  Pulse 78  Temp(Src) 97.9 F (36.6 C) (Oral)  Ht 6' (1.829 m)  Wt 285 lb 2 oz (129.332 kg)  BMI 38.66 kg/m2  SpO2 97%  General:   Well developed, well nourished . NAD.  HEENT:  Normocephalic . Face symmetric, atraumatic Lungs:  CTA B Normal respiratory effort, no intercostal retractions, no accessory muscle use. Heart: RRR,  no murmur.  no pretibial edema bilaterally  Abdomen:  Not distended, soft, non-tender. No rebound or rigidity. No mass,organomegaly Skin: Not pale. Not jaundice Neurologic:  alert & oriented X3.  Speech normal, gait appropriate for age and unassisted Psych--  Cognition and judgment appear intact.  Cooperative with normal attention span and concentration.  Behavior appropriate. No anxious or depressed appearing.      Assessment & Plan:   (Patient seen along with   Merilynn Finland, medical student)

## 2014-11-06 NOTE — Progress Notes (Signed)
Pre visit review using our clinic review tool, if applicable. No additional management support is needed unless otherwise documented below in the visit note. 

## 2014-11-06 NOTE — Patient Instructions (Signed)
Go to the lab   If you need more information about a healthy diet,  visit  the American Heart Association, it  is a great resource online at:  Mormon101.pl  All about diabetes, great resource!  http://www.molina.com/.html

## 2015-05-07 ENCOUNTER — Other Ambulatory Visit: Payer: Self-pay | Admitting: Internal Medicine

## 2015-05-28 ENCOUNTER — Encounter: Payer: 59 | Admitting: Internal Medicine

## 2015-06-11 ENCOUNTER — Encounter: Payer: Self-pay | Admitting: Internal Medicine

## 2015-06-11 ENCOUNTER — Ambulatory Visit (INDEPENDENT_AMBULATORY_CARE_PROVIDER_SITE_OTHER): Payer: 59 | Admitting: Internal Medicine

## 2015-06-11 VITALS — BP 124/80 | HR 66 | Temp 98.2°F | Ht 72.0 in | Wt 266.5 lb

## 2015-06-11 DIAGNOSIS — R739 Hyperglycemia, unspecified: Secondary | ICD-10-CM

## 2015-06-11 DIAGNOSIS — Z0000 Encounter for general adult medical examination without abnormal findings: Secondary | ICD-10-CM

## 2015-06-11 LAB — HEMOGLOBIN A1C: HEMOGLOBIN A1C: 5.7 % (ref 4.6–6.5)

## 2015-06-11 LAB — BASIC METABOLIC PANEL
BUN: 16 mg/dL (ref 6–23)
CO2: 27 mEq/L (ref 19–32)
CREATININE: 1.08 mg/dL (ref 0.40–1.50)
Calcium: 9.8 mg/dL (ref 8.4–10.5)
Chloride: 102 mEq/L (ref 96–112)
GFR: 99.78 mL/min (ref >60.00)
Glucose, Bld: 89 mg/dL (ref 70–99)
Potassium: 3.9 mEq/L (ref 3.5–5.1)
Sodium: 137 mEq/L (ref 135–145)

## 2015-06-11 LAB — LIPID PANEL
Cholesterol: 180 mg/dL (ref 0–200)
HDL: 48.9 mg/dL (ref >39.00)
LDL Cholesterol: 119 mg/dL — ABNORMAL HIGH (ref 0–99)
NONHDL: 130.72
Total CHOL/HDL Ratio: 4
Triglycerides: 57 mg/dL (ref 0.0–149.0)
VLDL: 11.4 mg/dL (ref 0.0–40.0)

## 2015-06-11 LAB — CBC WITH DIFFERENTIAL/PLATELET
BASOS ABS: 0.1 10*3/uL (ref 0.0–0.1)
Basophils Relative: 0.7 % (ref 0.0–3.0)
Eosinophils Absolute: 0.1 10*3/uL (ref 0.0–0.7)
Eosinophils Relative: 1.8 % (ref 0.0–5.0)
HCT: 44.9 % (ref 39.0–52.0)
Hemoglobin: 14.9 g/dL (ref 13.0–17.0)
LYMPHS ABS: 2.2 10*3/uL (ref 0.7–4.0)
LYMPHS PCT: 30.5 % (ref 12.0–46.0)
MCHC: 33.2 g/dL (ref 30.0–36.0)
MCV: 80.4 fl (ref 78.0–100.0)
MONOS PCT: 8.7 % (ref 3.0–12.0)
Monocytes Absolute: 0.6 10*3/uL (ref 0.1–1.0)
Neutro Abs: 4.3 10*3/uL (ref 1.4–7.7)
Neutrophils Relative %: 58.3 % (ref 43.0–77.0)
Platelets: 302 10*3/uL (ref 150.0–400.0)
RBC: 5.59 Mil/uL (ref 4.22–5.81)
RDW: 15 % (ref 11.5–15.5)
WBC: 7.3 10*3/uL (ref 4.0–10.5)

## 2015-06-11 LAB — AST: AST: 21 U/L (ref 0–37)

## 2015-06-11 LAB — ALT: ALT: 29 U/L (ref 0–53)

## 2015-06-11 MED ORDER — BENICAR 20 MG PO TABS: 20.0000 mg | ORAL_TABLET | Freq: Every day | ORAL | Status: DC

## 2015-06-11 MED ORDER — HYDROCHLOROTHIAZIDE 12.5 MG PO TABS: 12.5000 mg | ORAL_TABLET | Freq: Every day | ORAL | Status: DC

## 2015-06-11 NOTE — Progress Notes (Signed)
Subjective:    Patient ID: Ralph [name], male    DOB: 06-Feb-1981, 35 y.o.   MRN: [id number]  DOS:  06/11/2015 Type of visit - description : cpx Interval history: Good compliance with medications, doing very well with diet. In the last 6 months he lost about 40 pounds but lately has regained around 10. Ambulatory BPs 117/73 when checked. Would like to cut down on BP meds.  Wt Readings from Last 3 Encounters:  06/11/15 266 lb 8 oz (120.884 kg)  11/06/14 285 lb 2 oz (129.332 kg)  05/08/14 289 lb 8 oz (131.316 kg)     Review of Systems  Constitutional: No fever. No chills. No unexplained wt changes. No unusual sweats  HEENT: No dental problems, no ear discharge, no facial swelling, no voice changes. No eye discharge, no eye  redness , no  intolerance to light   Respiratory: No wheezing , no  difficulty breathing. No cough , no mucus production  Cardiovascular: No CP, no leg swelling , no  Palpitations  GI: no nausea, no vomiting, no diarrhea , no  abdominal pain.  No blood in the stools. No dysphagia, no odynophagia    Endocrine: No polyphagia, no polyuria , no polydipsia  GU: No dysuria, gross hematuria, difficulty urinating. No urinary urgency, no frequency.  Musculoskeletal: No joint swellings or unusual aches or pains  Skin: No change in the color of the skin, palor , no  Rash  Allergic, immunologic: No environmental allergies , no  food allergies  Neurological: No dizziness no  syncope. No headaches. No diplopia, no slurred, no slurred speech, no motor deficits, no facial  Numbness  Hematological: No enlarged lymph nodes, no easy bruising , no unusual bleedings  Psychiatry: No suicidal ideas, no hallucinations, no beavior problems, no confusion.  No unusual/severe anxiety, no depression  Past Medical History  Diagnosis Date  . Hyperglycemia 01/30/2011  . HTN (hypertension) 11/11/2010  . Kidney stone 09/29/2011  . Abnormal EKG 11/11/2010    Past Surgical History    Procedure Laterality Date  . Neck surgery      cyst?    Social History   Social History  . Marital Status: Single    Spouse Name: N/A  . Number of Children: 2  . Years of Education: N/A   Occupational History  . 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 Smoking status: Former Smoker -- 6 years    Types: Cigarettes    Quit date: 08/18/2010  . Smokeless tobacco: Never Used  . Alcohol Use: Yes     Comment: 1-2 drinks liquor a month  . Drug Use: No  . Sexual Activity: Not on file   Other Topics Concern  . 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 Diabetes Father   . Hypertension Maternal Grandfather   . Diabetes Paternal Grandmother   . Cancer Neg Hx   . Heart disease Neg Hx       Medication List       This list is accurate as of: 06/11/15 10:23 AM.  Always use your most recent med list.               BENICAR 20 MG tablet  Generic drug:  olmesartan  Take 1 tablet (20 mg total) by mouth daily.     hydrochlorothiazide 12.5 MG tablet  Commonly known as:  HYDRODIURIL  Take 1 tablet (12.5 mg total) by mouth daily.           Objective:   Physical Exam BP 124/80 mmHg  Pulse 66  Temp(Src) 98.2 F (36.8 C) (Oral)  Ht 6' (1.829 m)  Wt 266 lb 8 oz (120.884 kg)  BMI 36.14 kg/m2  SpO2 97% General:   Well developed, well nourished . NAD.  Neck:  Full range of motion. Supple. No  Thyromegaly  HEENT:  Normocephalic . Face symmetric, atraumatic Lungs:  CTA B Normal respiratory effort, no intercostal retractions, no accessory muscle use. Heart: RRR,  no murmur.  No pretibial edema bilaterally  Abdomen:  Not distended, soft, non-tender. No rebound or rigidity.  Skin: Exposed areas without rash. Not pale. Not jaundice Neurologic:  alert & oriented X3.  Speech normal, gait appropriate  for age and unassisted Strength symmetric and appropriate for age.  Psych: Cognition and judgment appear intact.  Cooperative with normal attention span and concentration.  Behavior appropriate. No anxious or depressed appearing.    Assessment & Plan:   Assessment Prediabetes HTN Kidney stones   Abnormal EKG, T waves, saw cards 2012, echo (-)  PLAN: Prediabetes: Doing great with diet, check A1c HTN: Doing great with diet, lost 40 pounds, regained 10. Plans to go back on a more strict diet. Likes to cut down on medications if possible: For now decrease HCTZ to only 12.5, monitor BPs, consider stop HCTZ. Continue Benicar RTC 6 months

## 2015-06-11 NOTE — Progress Notes (Signed)
Pre visit review using our clinic review tool, if applicable. No additional management support is needed unless otherwise documented below in the visit note. 

## 2015-06-11 NOTE — Patient Instructions (Signed)
BEFORE YOU LEAVE THE OFFICE: GO TO THE LAB  Get the blood work    GO TO THE FRONT DESK Schedule a routine office visit or check up to be done in  6 months  No  fasting     AFTER YOU LEAVE THE OFFICE: Decrease hydrochlorothiazide to 12.5 mg a day   Check the  blood pressure 2 or 3 times a week Be sure your blood pressure is between 110/65 and  145/85. If it is consistently higher or lower, let me know

## 2015-06-11 NOTE — Assessment & Plan Note (Addendum)
Td 2012 had a flu shot @ work   Diet exercise extensively discussed. Labs: BMP, AST, ALT, FLP, CBC, A1c

## 2015-11-19 ENCOUNTER — Telehealth: Payer: Self-pay | Admitting: *Deleted

## 2015-11-19 NOTE — Telephone Encounter (Signed)
PA form for Benicar 20mg  tabs completed and faxed to [facility] successfully at [phone]. Awaiting determination. JG//CMA

## 2015-11-23 NOTE — Telephone Encounter (Signed)
Can you give advice in PCP absence?

## 2015-11-23 NOTE — Telephone Encounter (Signed)
PA denied because Benicar is a plan exclusion so there is no coverage criteria to review and apply. Please advise. JG//CMA

## 2015-11-24 NOTE — Telephone Encounter (Signed)
Can see if he has been on losartan before. If not we can send in equivocal dose of medication if patient willing.

## 2015-11-26 MED ORDER — LOSARTAN POTASSIUM 50 MG PO TABS: 50.0000 mg | ORAL_TABLET | Freq: Every day | ORAL | Status: DC

## 2015-11-26 NOTE — Addendum Note (Signed)
Addended byConrad [place]: Divine Imber D on: 11/26/2015 09:46 AM   Modules accepted: Orders

## 2015-11-26 NOTE — Telephone Encounter (Signed)
thx

## 2015-11-26 NOTE — Telephone Encounter (Signed)
Per Selena Battenody, Losartan 50 mg 1 tablet daily, #30 and 1RF, and F/U w/ PCP in 2-3 weeks. Called and spoke w/ Pt, informed him that insurance would not cover Benicar and Cody recommended in PCP absence to start Losartan 50 mg which Pt believes he has not taken before. [facility], and follow-up scheduled for 12/24/2015 at 0930. Instructed Pt to call if chest pain, SOB, uncontrollable BP's. Pt verbalized understanding. Forwarding telephone note to PCP for FYI.

## 2015-12-10 ENCOUNTER — Ambulatory Visit: Payer: 59 | Admitting: Internal Medicine

## 2015-12-24 ENCOUNTER — Ambulatory Visit (INDEPENDENT_AMBULATORY_CARE_PROVIDER_SITE_OTHER): Payer: 59 | Admitting: Internal Medicine

## 2015-12-24 ENCOUNTER — Encounter: Payer: Self-pay | Admitting: Internal Medicine

## 2015-12-24 VITALS — BP 136/78 | HR 73 | Temp 97.8°F | Resp 12 | Ht 72.0 in | Wt 275.2 lb

## 2015-12-24 DIAGNOSIS — I10 Essential (primary) hypertension: Secondary | ICD-10-CM | POA: Diagnosis not present

## 2015-12-24 DIAGNOSIS — Z09 Encounter for follow-up examination after completed treatment for conditions other than malignant neoplasm: Secondary | ICD-10-CM | POA: Insufficient documentation

## 2015-12-24 LAB — BASIC METABOLIC PANEL
BUN: 11 mg/dL (ref 6–23)
CHLORIDE: 104 meq/L (ref 96–112)
CO2: 25 mEq/L (ref 19–32)
CREATININE: 1.19 mg/dL (ref 0.40–1.50)
Calcium: 9.2 mg/dL (ref 8.4–10.5)
GFR: 88.95 mL/min (ref >60.00)
GLUCOSE: 93 mg/dL (ref 70–99)
POTASSIUM: 3.6 meq/L (ref 3.5–5.1)
Sodium: 138 mEq/L (ref 135–145)

## 2015-12-24 MED ORDER — AMLODIPINE BESYLATE 5 MG PO TABS: 5.0000 mg | ORAL_TABLET | Freq: Every day | ORAL | 6 refills | Status: DC

## 2015-12-24 NOTE — Progress Notes (Signed)
Subjective:    Patient ID: Ralph [name], male    DOB: 05-14-1981, 35 y.o.   MRN: [id number]  DOS:  12/24/2015 Type of visit - description : Routine visit Interval history: HTN: Was switch to losartan due to cost, took it for few days, developed dizziness, self DC'd. Morbid obesity: He continued to gain weight since the last time he was here. Has a 3433-month-old baby, having a hard time eating healthy  Wt Readings from Last 3 Encounters:  12/24/15 275 lb 4 oz (124.9 kg)  06/11/15 266 lb 8 oz (120.9 kg)  11/06/14 285 lb 2 oz (129.3 kg)     Review of Systems  Denies chest pain or difficulty breathing No nausea, vomiting, diarrhea  Past Medical History:  Diagnosis Date  . Abnormal EKG 11/11/2010  . HTN (hypertension) 11/11/2010  . Hyperglycemia 01/30/2011  . Kidney stone 09/29/2011    Past Surgical History:  Procedure Laterality Date  . NECK SURGERY     cyst?    Social History   Social History  . Marital status: Single    Spouse name: N/A  . Number of children: 2  . Years of education: N/A   Occupational History  . 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 Smoking status: Former Smoker    Years: 6.00    Types: Cigarettes    Quit date: 08/18/2010  . Smokeless tobacco: Never Used  . Alcohol use Yes     Comment: 1-2 drinks liquor a month  . Drug use: No  . Sexual activity: Not on file   Other Topics Concern  . Not on file   Social History Narrative   Works as a Estate agenttruck driver   Single- 2 children born 2001 daughter and 2007 son, they live w/ pt             Medication List       Accurate as of 12/24/15  5:08 PM. Always use your most recent med list.          amLODipine 5 MG tablet Commonly known as:  NORVASC Take 1 tablet (5 mg total) by mouth daily.   hydrochlorothiazide 12.5 MG tablet Commonly known as:  HYDRODIURIL Take 1 tablet (12.5 mg total) by mouth daily.          Objective:   Physical Exam BP 136/78 (BP Location: Left  Arm, Patient Position: Sitting, Cuff Size: Normal)   Pulse 73   Temp 97.8 F (36.6 C) (Oral)   Resp 12   Ht 6' (1.829 m)   Wt 275 lb 4 oz (124.9 kg)   SpO2 96%   BMI 37.33 kg/m  General:   Well developed, well nourished . NAD.  HEENT:  Normocephalic . Face symmetric, atraumatic Lungs:  CTA B Normal respiratory effort, no intercostal retractions, no accessory muscle use. Heart: RRR,  no murmur.  No pretibial edema bilaterally  Skin: Not pale. Not jaundice Neurologic:  alert & oriented X3.  Speech normal, gait appropriate for age and unassisted Psych--  Cognition and judgment appear intact.  Cooperative with normal attention span and concentration.  Behavior appropriate. No anxious or depressed appearing.      Assessment & Plan:    Assessment Prediabetes HTN - self d/c losartan ~ 11-2015 d/t dizziness, ok w/ benicar Morbid obesity Kidney stones   Abnormal EKG, T waves, saw cards 2012, echo (-)  PLAN: HTN: Benicar was expensive, was switch to losartan about 5 weeks ago,  took it temporarily, developed dizziness, self d/c  it. Currently only on HCTZ. BP today is satisfactory. I anticipate his BP will gradually increase. Plan: BMP today, add amlodipine, s/e discussed. Morbid obesity: BMI 37, has HTN and prediabetes. Encourage to go back to his previous healthier diet, at some point he was able to lose 40 pounds. RTC 1- 2018, CPX.

## 2015-12-24 NOTE — Progress Notes (Signed)
Pre visit review using our clinic review tool, if applicable. No additional management support is needed unless otherwise documented below in the visit note. 

## 2015-12-24 NOTE — Assessment & Plan Note (Signed)
HTN: Benicar was expensive, was switch to losartan about 5 weeks ago, took it temporarily, developed dizziness, self d/c  it. Currently only on HCTZ. BP today is satisfactory. I anticipate his BP will gradually increase. Plan: BMP today, add amlodipine, s/e discussed. Morbid obesity: BMI 37, has HTN and prediabetes. Encourage to go back to his previous healthier diet, at some point he was able to lose 40 pounds. RTC 1- 2018, CPX.

## 2015-12-24 NOTE — Patient Instructions (Addendum)
GO TO THE LAB : Get the blood work     GO TO THE FRONT DESK Schedule your next appointment for a  Physical by 1 -2018   For blood pressure: Continue hydrochlorothiazide  Add AMLODIPINE   Check the  blood pressure  weekly   Be sure your blood pressure is between 110/65 and  135/85. If it is consistently higher or lower, let me know

## 2016-03-13 ENCOUNTER — Other Ambulatory Visit: Payer: Self-pay | Admitting: Internal Medicine

## 2016-06-23 ENCOUNTER — Encounter: Payer: 59 | Admitting: Internal Medicine

## 2016-06-30 ENCOUNTER — Encounter: Payer: 59 | Admitting: Internal Medicine

## 2016-08-11 ENCOUNTER — Encounter: Payer: 59 | Admitting: Internal Medicine

## 2016-10-24 ENCOUNTER — Other Ambulatory Visit: Payer: Self-pay | Admitting: Internal Medicine

## 2016-11-10 ENCOUNTER — Encounter: Payer: Self-pay | Admitting: Internal Medicine

## 2016-11-10 ENCOUNTER — Ambulatory Visit (INDEPENDENT_AMBULATORY_CARE_PROVIDER_SITE_OTHER): Payer: 59 | Admitting: Internal Medicine

## 2016-11-10 VITALS — BP 144/88 | HR 99 | Temp 97.9°F | Resp 14 | Ht 72.0 in | Wt 290.5 lb

## 2016-11-10 DIAGNOSIS — I10 Essential (primary) hypertension: Secondary | ICD-10-CM

## 2016-11-10 DIAGNOSIS — Z114 Encounter for screening for human immunodeficiency virus [HIV]: Secondary | ICD-10-CM

## 2016-11-10 DIAGNOSIS — Z0001 Encounter for general adult medical examination with abnormal findings: Secondary | ICD-10-CM | POA: Diagnosis not present

## 2016-11-10 DIAGNOSIS — Z0000 Encounter for general adult medical examination without abnormal findings: Secondary | ICD-10-CM

## 2016-11-10 MED ORDER — OLMESARTAN MEDOXOMIL-HCTZ 20-12.5 MG PO TABS: 1.0000 | ORAL_TABLET | Freq: Every day | ORAL | 1 refills | Status: DC

## 2016-11-10 MED ORDER — AMLODIPINE BESYLATE 10 MG PO TABS: 10.0000 mg | ORAL_TABLET | Freq: Every day | ORAL | 6 refills | Status: DC

## 2016-11-10 NOTE — Assessment & Plan Note (Signed)
--  Td 2012 --Labs: CMP, CBC, A1c, FLP, TSH, HIV -- Exercise discussed

## 2016-11-10 NOTE — Assessment & Plan Note (Addendum)
Prediabetes: Check A1c HTN: Not well controlled lately, he takes HCTZ and self increase amlodipine to 10 mg ~ 4 weeks ago. Self-check BPs w/ a wrist cuff. Plan: Stop HCTZ, start losartan HCT 20-12.5 ----> 1 tablet daily Continue with amlodipine 10 mg, prescription sent Check BPs with a arm cuff. Definitely work on diet and exercise, low-salt diet. has gained a significant amount of weight. See AVS. Nurse visit 2 weeks.

## 2016-11-10 NOTE — Progress Notes (Signed)
Pre visit review using our clinic review tool, if applicable. No additional management support is needed unless otherwise documented below in the visit note. 

## 2016-11-10 NOTE — Progress Notes (Signed)
Subjective:    Patient ID: Ralph [name], male    DOB: [birth year], 36 y.o.   MRN: [id number]  DOS:  11/10/2016 Type of visit - description :  CPX Interval history: Started to check his BP with a wrist cuff about a month ago, his BP in the afternoon is usually in the 170s/108. He self increase amlodipine to 10 mg daily. BPs slightly better?. Has gained a significant amount of weight. Admits his diet has not been very good lately  Wt Readings from Last 3 Encounters:  11/10/16 290 lb 8 oz (131.8 kg)  12/24/15 275 lb 4 oz (124.9 kg)  06/11/15 266 lb 8 oz (120.9 kg)     Review of Systems  A 14 point review of systems is negative   Past Medical History:  Diagnosis Date  . Abnormal EKG 11/11/2010  . HTN (hypertension) 11/11/2010  . Hyperglycemia 01/30/2011  . Kidney stone 09/29/2011    Past Surgical History:  Procedure Laterality Date  . NECK SURGERY     cyst?    Social History   Social History  . Marital status: Married    Spouse name: N/A  . Number of children: 2  . Years of education: N/A   Occupational History  . 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 Smoking status: Former Smoker    Years: 6.00    Types: Cigarettes    Quit date: 08/18/2010  . Smokeless tobacco: Never Used  . Alcohol use Yes     Comment: 1-2 drinks liquor a month  . Drug use: No  . Sexual activity: Not on file   Other Topics Concern  . Not on file   Social History Narrative   Works as a Financial risk analyst, 2 children : 2001 daughter and 2007 son, they live w/ pt          Family History  Problem Relation Age of Onset  . Diabetes Father   . Hypertension Maternal Grandfather   . Diabetes Paternal Grandmother   . Cancer Neg Hx   . Heart disease Neg Hx      Allergies as of 11/10/2016   No Known Allergies     Medication List       Accurate as of 11/10/16  5:42 PM. Always use your most recent med list.          amLODipine 10 MG tablet Commonly known  as:  NORVASC Take 1 tablet (10 mg total) by mouth daily.   olmesartan-hydrochlorothiazide 20-12.5 MG tablet Commonly known as:  BENICAR HCT Take 1 tablet by mouth daily.          Objective:   Physical Exam BP (!) 144/88 (BP Location: Left Arm, Patient Position: Sitting, Cuff Size: Normal)   Pulse 99   Temp 97.9 F (36.6 C) (Oral)   Resp 14   Ht 6' (1.829 m)   Wt 290 lb 8 oz (131.8 kg)   SpO2 97%   BMI 39.40 kg/m   General:   Well developed, well nourished . NAD.  Neck: No  thyromegaly  HEENT:  Normocephalic . Face symmetric, atraumatic Lungs:  CTA B Normal respiratory effort, no intercostal retractions, no accessory muscle use. Heart: RRR,  no murmur.  No pretibial edema bilaterally  Abdomen:  Not distended, soft, non-tender. No rebound or rigidity.   Skin: Exposed areas without rash. Not pale. Not jaundice Neurologic:  alert & oriented X3.  Speech normal, gait appropriate  for age and unassisted Strength symmetric and appropriate for age.  Psych: Cognition and judgment appear intact.  Cooperative with normal attention span and concentration.  Behavior appropriate. No anxious or depressed appearing.    Assessment & Plan:   Assessment Prediabetes HTN - self d/c losartan ~ 11-2015 d/t dizziness, ok w/ benicar Morbid obesity Kidney stones   Abnormal EKG, T waves, saw cards 2012, echo (-)  PLAN: Prediabetes: Check A1c HTN: Not well controlled lately, he takes HCTZ and self increase amlodipine to 10 mg ~ 4 weeks ago. Self-check BPs w/ a wrist cuff. Plan: Stop HCTZ, start losartan HCT 20-12.5 ----> 1 tablet daily Continue with amlodipine 10 mg, prescription sent Check BPs with a arm cuff. Definitely work on diet and exercise, low-salt diet. has gained a significant amount of weight. See AVS. Nurse visit 2 weeks.

## 2016-11-10 NOTE — Patient Instructions (Signed)
GO TO THE LAB : Get the blood work     GO TO THE FRONT DESK Schedule your next appointment for a  nurse visit 2 weeks from today They will check your blood pressure Come back fasting.   Get a arm cuff to check your blood pressure if possible Check the  blood pressure  Daily, write down your readings and bring them to my nurse Be sure your blood pressure is between 110/65 and  140/85. If it is consistently higher or lower, let me know  Medications  --Stop hydrochlorothiazide --Start olmesartan HCT 1 tablet daily --continue with amlodipine 10 mg daily  To get free nformation about diet and exercise: Visit the American Heart Association website. Also American diabetes Association website

## 2016-11-11 LAB — CBC WITH DIFFERENTIAL/PLATELET
BASOS ABS: 0.1 10*3/uL (ref 0.0–0.1)
BASOS PCT: 1.4 % (ref 0.0–3.0)
Eosinophils Absolute: 0.2 10*3/uL (ref 0.0–0.7)
Eosinophils Relative: 2.1 % (ref 0.0–5.0)
HEMATOCRIT: 45 % (ref 39.0–52.0)
HEMOGLOBIN: 15.2 g/dL (ref 13.0–17.0)
LYMPHS PCT: 31.3 % (ref 12.0–46.0)
Lymphs Abs: 2.7 10*3/uL (ref 0.7–4.0)
MCHC: 33.7 g/dL (ref 30.0–36.0)
MCV: 80.8 fl (ref 78.0–100.0)
MONOS PCT: 11.9 % (ref 3.0–12.0)
Monocytes Absolute: 1 10*3/uL (ref 0.1–1.0)
NEUTROS ABS: 4.6 10*3/uL (ref 1.4–7.7)
Neutrophils Relative %: 53.3 % (ref 43.0–77.0)
PLATELETS: 308 10*3/uL (ref 150.0–400.0)
RBC: 5.57 Mil/uL (ref 4.22–5.81)
RDW: 15 % (ref 11.5–15.5)
WBC: 8.6 10*3/uL (ref 4.0–10.5)

## 2016-11-11 LAB — COMPREHENSIVE METABOLIC PANEL
ALT: 42 U/L (ref 0–53)
AST: 29 U/L (ref 0–37)
Albumin: 4.5 g/dL (ref 3.5–5.2)
Alkaline Phosphatase: 52 U/L (ref 39–117)
BUN: 16 mg/dL (ref 6–23)
CALCIUM: 10 mg/dL (ref 8.4–10.5)
CHLORIDE: 103 meq/L (ref 96–112)
CO2: 25 meq/L (ref 19–32)
Creatinine, Ser: 1.27 mg/dL (ref 0.40–1.50)
GFR: 82.11 mL/min (ref >60.00)
Glucose, Bld: 78 mg/dL (ref 70–99)
Potassium: 3.5 mEq/L (ref 3.5–5.1)
Sodium: 137 mEq/L (ref 135–145)
Total Bilirubin: 0.4 mg/dL (ref 0.2–1.2)
Total Protein: 7.2 g/dL (ref 6.0–8.3)

## 2016-11-11 LAB — HEMOGLOBIN A1C: Hgb A1c MFr Bld: 6 % (ref 4.6–6.5)

## 2016-11-11 LAB — HIV ANTIBODY (ROUTINE TESTING W REFLEX): HIV 1&2 Ab, 4th Generation: NONREACTIVE

## 2016-11-11 LAB — TSH: TSH: 1.93 u[IU]/mL (ref 0.35–4.50)

## 2016-11-28 ENCOUNTER — Ambulatory Visit (INDEPENDENT_AMBULATORY_CARE_PROVIDER_SITE_OTHER): Payer: 59 | Admitting: Internal Medicine

## 2016-11-28 ENCOUNTER — Other Ambulatory Visit: Payer: 59

## 2016-11-28 VITALS — BP 133/88 | HR 92

## 2016-11-28 DIAGNOSIS — I10 Essential (primary) hypertension: Secondary | ICD-10-CM | POA: Diagnosis not present

## 2016-11-28 NOTE — Patient Instructions (Signed)
Per Dr. Drue NovelPaz: Continue current medications & regimen. Complete BMP lab work today. Follow-up with PCP in 3 months.

## 2016-11-28 NOTE — Progress Notes (Signed)
Pre visit review using our clinic review tool, if applicable. No additional management support is needed unless otherwise documented below in the visit note.  Patient presents in office for blood pressure check per OV note 11/10/16. He voiced adherence to medications & regimen. Patient is asymptomatic; denies chest pain, headaches, numbness/tingling in limbs, dizziness & lightheadedness. RN obtained the following readings: BP 134/90 P 92 & BP 133/88 P 92.  Per Dr. Drue NovelPaz: Continue current medications & regimen. Complete BMP lab work today. Follow-up with PCP in 3 months.  Informed patient of the provider's recommendations. He verbalized understanding. Next appointment scheduled for 03/02/17 at 8:30 AM.  Willow OraJose Paz, MD

## 2016-11-29 LAB — BASIC METABOLIC PANEL
BUN: 12 mg/dL (ref 7–25)
CALCIUM: 10.2 mg/dL (ref 8.6–10.3)
CO2: 19 mmol/L — ABNORMAL LOW (ref 20–31)
Chloride: 103 mmol/L (ref 98–110)
Creat: 1.19 mg/dL (ref 0.60–1.35)
GLUCOSE: 82 mg/dL (ref 65–99)
Potassium: 3.7 mmol/L (ref 3.5–5.3)
SODIUM: 139 mmol/L (ref 135–146)

## 2017-02-09 ENCOUNTER — Other Ambulatory Visit: Payer: Self-pay | Admitting: Internal Medicine

## 2017-03-02 ENCOUNTER — Ambulatory Visit: Payer: 59 | Admitting: Internal Medicine

## 2017-03-30 ENCOUNTER — Ambulatory Visit: Payer: Self-pay | Admitting: Internal Medicine

## 2017-04-19 ENCOUNTER — Other Ambulatory Visit: Payer: Self-pay | Admitting: Internal Medicine

## 2017-05-25 ENCOUNTER — Encounter: Payer: Self-pay | Admitting: Internal Medicine

## 2017-05-25 ENCOUNTER — Ambulatory Visit: Payer: 59 | Admitting: Internal Medicine

## 2017-05-25 VITALS — BP 128/78 | HR 84 | Temp 98.1°F | Resp 14 | Ht 72.0 in | Wt 282.4 lb

## 2017-05-25 DIAGNOSIS — R739 Hyperglycemia, unspecified: Secondary | ICD-10-CM

## 2017-05-25 DIAGNOSIS — I10 Essential (primary) hypertension: Secondary | ICD-10-CM

## 2017-05-25 LAB — BASIC METABOLIC PANEL
BUN: 14 mg/dL (ref 6–23)
CALCIUM: 9.7 mg/dL (ref 8.4–10.5)
CO2: 25 meq/L (ref 19–32)
CREATININE: 1.12 mg/dL (ref 0.40–1.50)
Chloride: 102 mEq/L (ref 96–112)
GFR: 94.64 mL/min (ref >60.00)
GLUCOSE: 87 mg/dL (ref 70–99)
Potassium: 3.5 mEq/L (ref 3.5–5.1)
Sodium: 137 mEq/L (ref 135–145)

## 2017-05-25 MED ORDER — OLMESARTAN MEDOXOMIL-HCTZ 20-12.5 MG PO TABS: 1.0000 | ORAL_TABLET | Freq: Every day | ORAL | 6 refills | Status: DC

## 2017-05-25 MED ORDER — AMLODIPINE BESYLATE 10 MG PO TABS: 10.0000 mg | ORAL_TABLET | Freq: Every day | ORAL | 6 refills | Status: DC

## 2017-05-25 NOTE — Patient Instructions (Addendum)
GO TO THE LAB : Get the blood work     GO TO THE FRONT DESK Schedule your next appointment for a  Physical in 6 or 7 months, fasting    Check the  blood pressure 2 or 3 times a month  Be sure your blood pressure is between 110/65 and  135/85. If it is consistently higher or lower, let me know    SELF LEARNING IS VERY IMPORTANT FOR YOUR HEALTH GENERAL INFORMATION ABOUT HEALTHY EATING, CHOLESTEROL, HIGH BLOOD PRESSURE, QUIT TOBACCO :  The American Heart Association, http://www.heart.org  Check the "Life's Simple 7" from the AHA The American diabetes Association  Http://www.diabetes.org  "[facility] A to Z Health Guide" book

## 2017-05-25 NOTE — Progress Notes (Signed)
Pre visit review using our clinic review tool, if applicable. No additional management support is needed unless otherwise documented below in the visit note. 

## 2017-05-25 NOTE — Progress Notes (Signed)
Subjective:    Patient ID: Ralph [name], male    DOB: 04-10-1981, 37 y.o.   MRN: [id number]  DOS:  05/25/2017 Type of visit - description : f/u Interval history: Since last OV doing well. No major concerns, good compliance with medications, ambulatory BP is 130/80 usually.   Review of Systems Denies chest pain, difficulty breathing.  No lower extremity edema or cough  Past Medical History:  Diagnosis Date  . Abnormal EKG 11/11/2010  . HTN (hypertension) 11/11/2010  . Hyperglycemia 01/30/2011  . Kidney stone 09/29/2011    Past Surgical History:  Procedure Laterality Date  . NECK SURGERY     cyst?    Social History   Socioeconomic History  . Marital status: Married    Spouse name: Not on file  . Number of children: 2  . Years of education: Not on file  . Highest education level: Not on file  Social Needs  . Financial resource strain: Not on file  . Food insecurity - worry: Not on file  . Food insecurity - inability: Not on file  . Transportation needs - medical: Not on file  . Transportation needs - non-medical: Not on file  Occupational History  . Occupation: truck Clinical research associatedriver     Employer: [employer]  Tobacco Use  . Smoking status: Former Smoker    Years: 6.00    Types: Cigarettes    Last attempt to quit: 08/18/2010    Years since quitting: 6.7  . Smokeless tobacco: Never Used  Substance and Sexual Activity  . Alcohol use: Yes    Comment: 1-2 drinks liquor a month  . Drug use: No  . Sexual activity: Not on file  Other Topics Concern  . 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 Always use your most recent med list.          amLODipine 10 MG tablet Commonly known as:  NORVASC Take 1 tablet (10 mg total) by mouth daily.     olmesartan-hydrochlorothiazide 20-12.5 MG tablet Commonly known as:  BENICAR HCT Take 1 tablet by mouth daily.          Objective:   Physical Exam BP 128/78 (BP Location: Left Arm, Patient Position: Sitting, Cuff Size: Small)   Pulse 84   Temp 98.1 F (36.7 C) (Oral)   Resp 14   Ht 6' (1.829 m)   Wt 282 lb 6 oz (128.1 kg)   SpO2 97%   BMI 38.30 kg/m  General:   Well developed, well nourished . NAD.  HEENT:  Normocephalic . Face symmetric, atraumatic Lungs:  CTA B Normal respiratory effort, no intercostal retractions, no accessory muscle use. Heart: RRR,  no murmur.  No pretibial edema bilaterally  Skin: Not pale. Not jaundice Neurologic:  alert & oriented X3.  Speech normal, gait appropriate for age and unassisted Psych--  Cognition and judgment appear intact.  Cooperative with normal attention span and concentration.  Behavior appropriate. No anxious or depressed appearing.      Assessment & Plan:   Assessment Prediabetes HTN - self d/c losartan ~ 11-2015 d/t dizziness, ok w/ benicar Morbid obesity Kidney stones  Abnormal EKG, T waves, saw cards 2012, echo (-)  PLAN: HTN: Currently on amlodipine and Benicar HCT.  Good ambulatory BPs, no s/e, plan: BMP and send a refill. Prediabetes: Last A1c satisfactory Patient education: Diet, exercise, encouraged self Kloromin, see AVS RTC 6-7 months, CPX fasting.

## 2017-05-26 NOTE — Assessment & Plan Note (Signed)
HTN: Currently on amlodipine and Benicar HCT.  Good ambulatory BPs, no s/e, plan: BMP and send a refill. Prediabetes: Last A1c satisfactory Patient education: Diet, exercise, encouraged self Kloromin, see AVS RTC 6-7 months, CPX fasting.

## 2017-06-15 ENCOUNTER — Encounter: Payer: 59 | Admitting: Internal Medicine

## 2017-09-08 ENCOUNTER — Telehealth: Payer: Self-pay

## 2017-09-08 MED ORDER — OLMESARTAN MEDOXOMIL 20 MG PO TABS: 20.0000 mg | ORAL_TABLET | Freq: Every day | ORAL | 3 refills | Status: DC

## 2017-09-08 MED ORDER — HYDROCHLOROTHIAZIDE 12.5 MG PO CAPS: 12.5000 mg | ORAL_CAPSULE | Freq: Every day | ORAL | 3 refills | Status: DC

## 2017-09-08 NOTE — Telephone Encounter (Signed)
Olmesartan 20mg  daily and hctz 12.5mg  daily sent separately to [facility].

## 2017-09-08 NOTE — Telephone Encounter (Signed)
[facility]- olmesartan-hctz 20-12.5mg  on backorder- can we split meds? Please advise?

## 2017-09-08 NOTE — Telephone Encounter (Signed)
Yes. Ok to rx separately

## 2017-11-23 ENCOUNTER — Encounter: Payer: 59 | Admitting: Internal Medicine

## 2017-12-21 ENCOUNTER — Encounter: Payer: 59 | Admitting: Internal Medicine

## 2017-12-29 ENCOUNTER — Other Ambulatory Visit: Payer: Self-pay

## 2017-12-29 MED ORDER — AMLODIPINE BESYLATE 10 MG PO TABS: 10.0000 mg | ORAL_TABLET | Freq: Every day | ORAL | 0 refills | Status: DC

## 2017-12-29 NOTE — Telephone Encounter (Signed)
Received fax from patient's pharmacy requesting refill on amlodipine. Patient last seen in January. Refill sent in but will need ov for further refills.

## 2018-01-11 ENCOUNTER — Encounter: Payer: Self-pay | Admitting: Internal Medicine

## 2018-01-11 ENCOUNTER — Ambulatory Visit (INDEPENDENT_AMBULATORY_CARE_PROVIDER_SITE_OTHER): Payer: 59 | Admitting: Internal Medicine

## 2018-01-11 VITALS — BP 126/90 | HR 108 | Temp 98.3°F | Resp 16 | Ht 74.0 in | Wt 293.6 lb

## 2018-01-11 DIAGNOSIS — Z0000 Encounter for general adult medical examination without abnormal findings: Secondary | ICD-10-CM | POA: Diagnosis not present

## 2018-01-11 DIAGNOSIS — I10 Essential (primary) hypertension: Secondary | ICD-10-CM

## 2018-01-11 MED ORDER — AMLODIPINE BESYLATE 10 MG PO TABS: 10.0000 mg | ORAL_TABLET | Freq: Every day | ORAL | 6 refills | Status: DC

## 2018-01-11 MED ORDER — LOSARTAN POTASSIUM 50 MG PO TABS: 50.0000 mg | ORAL_TABLET | Freq: Every day | ORAL | 1 refills | Status: DC

## 2018-01-11 NOTE — Progress Notes (Signed)
Subjective:    Patient ID: Ralph [name], male    DOB: 1980-07-07, 37 y.o.   MRN: [id number]  DOS:  01/11/2018 Type of visit - description : cpx Interval history: no major concerns  Wt Readings from Last 3 Encounters:  01/11/18 293 lb 9.6 oz (133.2 kg)  05/25/17 282 lb 6 oz (128.1 kg)  11/10/16 290 lb 8 oz (131.8 kg)     Review of Systems A 14 point review of systems is negative    Past Medical History:  Diagnosis Date  . Abnormal EKG 11/11/2010  . HTN (hypertension) 11/11/2010  . Hyperglycemia 01/30/2011  . Kidney stone 09/29/2011    Past Surgical History:  Procedure Laterality Date  . NECK SURGERY     cyst?    Social History   Socioeconomic History  . Marital status: Married    Spouse name: Not on file  . Number of children: 5  . Years of education: Not on file  . Highest education level: Not on file  Occupational History  . Occupation: truck Clinical research associatedriver     Employer: [employer]  Social Needs  . Financial resource strain: Not on file  . Food insecurity:    Worry: Not on file    Inability: Not on file  . Transportation needs:    Medical: Not on file    Non-medical: Not on file  Tobacco Use  . Smoking status: Former Smoker    Years: 6.00    Types: Cigarettes    Last attempt to quit: 08/18/2010    Years since quitting: 7.4  . Smokeless tobacco: Never Used  Substance and Sexual Activity  . Alcohol use: Yes    Comment: 1-2 drinks liquor a month  . Drug use: No  . Sexual activity: Not on file  Lifestyle  . Physical activity:    Days per week: Not on file    Minutes per session: Not on file  . Stress: Not on file  Relationships  . Social connections:    Talks on phone: Not on file    Gets together: Not on file    Attends religious service: Not on file    Active member of club or organization: Not on file    Attends meetings of clubs or organizations: Not on file    Relationship status: Not on file  . Intimate partner violence:    Fear of current or ex  partner: Not on file    Emotionally abused: Not on file    Physically abused: Not on file    Forced sexual activity: Not on file  Other Topics Concern  . Not on file  Social History Narrative   Works as a Naval architecttruck driver         Family History  Problem Relation Age of Onset  . Diabetes Father   . Hypertension Maternal Grandfather   . Diabetes Paternal Grandmother   . Cancer Neg Hx   . Heart disease Neg Hx      Allergies as of 01/11/2018   No Known Allergies     Medication List        Accurate as of 01/11/18 11:59 PM. Always use your most recent med list.          amLODipine 10 MG tablet Commonly known as:  NORVASC Take 1 tablet (10 mg total) by mouth daily.   losartan 50 MG tablet Commonly known as:  COZAAR Take 1 tablet (50 mg total) by mouth daily.  Objective:   Physical Exam BP 126/90 (BP Location: Left Arm, Patient Position: Sitting, Cuff Size: Large)   Pulse (!) 108   Temp 98.3 F (36.8 C) (Oral)   Resp 16   Ht 6\' 2"  (1.88 m)   Wt 293 lb 9.6 oz (133.2 kg)   SpO2 96%   BMI 37.70 kg/m  General: Well developed, NAD, see BMI.  Neck: No  thyromegaly  HEENT:  Normocephalic . Face symmetric, atraumatic Lungs:  CTA B Normal respiratory effort, no intercostal retractions, no accessory muscle use. Heart: RRR,  no murmur.  No pretibial edema bilaterally  Abdomen:  Not distended, soft, non-tender. No rebound or rigidity.   Skin: Exposed areas without rash. Not pale. Not jaundice Neurologic:  alert & oriented X3.  Speech normal, gait appropriate for age and unassisted Strength symmetric and appropriate for age.  Psych: Cognition and judgment appear intact.  Cooperative with normal attention span and concentration.  Behavior appropriate. No anxious or depressed appearing.     Assessment & Plan:   Assessment Prediabetes HTN - self d/c losartan ~ 11-2015 d/t dizziness, ok w/ benicar Morbid obesity Kidney stones   Abnormal EKG, T waves,  saw cards 2012, echo (-)  PLAN: Pre diabetes: Has gained weight, extensive discussion about diet. HTN: At the last visit, he was taking amlodipine and Benicar HCT with good control.  Because of poor availability we had to split Benicar HCT on 2 tablets and somehow he is only taking the HCT portion of it. He has prediabetes, he really would benefit from [facility] Plan: Continue amlodipine, start losartan 50 mg qd , stop HCTZ, monitor BPs, BMP today and in 2 weeks.   Morbid obesity: Counseled. RTC  6 months

## 2018-01-11 NOTE — Patient Instructions (Signed)
GO TO THE LAB : Get the blood work    GO TO THE FRONT DESK --You need blood work again in 2 weeks, please make an appointment --You need routine follow-up in 6 months from today   For high blood pressure: Continue amlodipine Stop hydrochlorothiazide Start a medication called losartan   Check the  blood pressure 2 or 3 times week Be sure your blood pressure is between 110/65 and  135/85. If it is consistently higher or lower, let me know  PRE- DIABETES self learn tools: The American diabetes Association     diabetes.org  Visit [facility] website it is a [employer] Officerg[employer] resource  joslin.org  The [facility] web site has a diabetes section  [email or web address]

## 2018-01-11 NOTE — Assessment & Plan Note (Signed)
--  Td 2012 --Labs: CMP, CBC, A1c, FLP -- Exercise discussed

## 2018-01-12 LAB — CBC
HEMATOCRIT: 45.6 % (ref 39.0–52.0)
Hemoglobin: 15.2 g/dL (ref 13.0–17.0)
MCHC: 33.4 g/dL (ref 30.0–36.0)
MCV: 81.2 fl (ref 78.0–100.0)
Platelets: 327 10*3/uL (ref 150.0–400.0)
RBC: 5.62 Mil/uL (ref 4.22–5.81)
RDW: 15.2 % (ref 11.5–15.5)
WBC: 7.1 10*3/uL (ref 4.0–10.5)

## 2018-01-12 LAB — COMPREHENSIVE METABOLIC PANEL
ALBUMIN: 4.7 g/dL (ref 3.5–5.2)
ALT: 59 U/L — ABNORMAL HIGH (ref 0–53)
AST: 30 U/L (ref 0–37)
Alkaline Phosphatase: 50 U/L (ref 39–117)
BUN: 11 mg/dL (ref 6–23)
CHLORIDE: 103 meq/L (ref 96–112)
CO2: 27 mEq/L (ref 19–32)
Calcium: 10.4 mg/dL (ref 8.4–10.5)
Creatinine, Ser: 1.28 mg/dL (ref 0.40–1.50)
GFR: 80.85 mL/min (ref >60.00)
Glucose, Bld: 86 mg/dL (ref 70–99)
POTASSIUM: 3.8 meq/L (ref 3.5–5.1)
SODIUM: 138 meq/L (ref 135–145)
Total Bilirubin: 0.7 mg/dL (ref 0.2–1.2)
Total Protein: 7.7 g/dL (ref 6.0–8.3)

## 2018-01-12 LAB — LIPID PANEL
CHOLESTEROL: 193 mg/dL (ref 0–200)
HDL: 44.9 mg/dL (ref >39.00)
LDL Cholesterol: 121 mg/dL — ABNORMAL HIGH (ref 0–99)
NonHDL: 147.95
TRIGLYCERIDES: 136 mg/dL (ref 0.0–149.0)
Total CHOL/HDL Ratio: 4
VLDL: 27.2 mg/dL (ref 0.0–40.0)

## 2018-01-12 LAB — HEMOGLOBIN A1C: HEMOGLOBIN A1C: 5.9 % (ref 4.6–6.5)

## 2018-01-12 NOTE — Assessment & Plan Note (Signed)
  Pre diabetes: Has gained weight, extensive discussion about diet. HTN: At the last visit, he was taking amlodipine and Benicar HCT with good control.  Because of poor availability we had to split Benicar HCT on 2 tablets and somehow he is only taking the HCT portion of it. He has prediabetes, he really would benefit from [facility]'s Plan: Continue amlodipine, start losartan 50 mg qd , stop HCTZ, monitor BPs, BMP today and in 2 weeks.   Morbid obesity: Counseled. RTC  6 months

## 2018-01-25 ENCOUNTER — Other Ambulatory Visit (INDEPENDENT_AMBULATORY_CARE_PROVIDER_SITE_OTHER): Payer: 59

## 2018-01-25 DIAGNOSIS — I10 Essential (primary) hypertension: Secondary | ICD-10-CM

## 2018-01-25 LAB — BASIC METABOLIC PANEL
BUN: 9 mg/dL (ref 6–23)
CALCIUM: 9.1 mg/dL (ref 8.4–10.5)
CO2: 25 meq/L (ref 19–32)
Chloride: 106 mEq/L (ref 96–112)
Creatinine, Ser: 1.14 mg/dL (ref 0.40–1.50)
GFR: 92.4 mL/min (ref >60.00)
Glucose, Bld: 101 mg/dL — ABNORMAL HIGH (ref 70–99)
Potassium: 4.3 mEq/L (ref 3.5–5.1)
SODIUM: 138 meq/L (ref 135–145)

## 2018-01-27 ENCOUNTER — Encounter: Payer: Self-pay | Admitting: Internal Medicine

## 2018-02-10 ENCOUNTER — Other Ambulatory Visit: Payer: Self-pay | Admitting: Internal Medicine

## 2018-02-10 ENCOUNTER — Other Ambulatory Visit: Payer: Self-pay

## 2018-02-10 MED ORDER — AMLODIPINE BESYLATE 10 MG PO TABS: 10.0000 mg | ORAL_TABLET | Freq: Every day | ORAL | 6 refills | Status: DC

## 2018-02-10 NOTE — Telephone Encounter (Signed)
Copied from CRM [id number]. Topic: Quick Communication - Rx Refill/Question >> Feb 10, 2018 11:16 AM Alexander Bergeron B wrote: Pharmacy states that they cant fill  Medication: amLODipine (NORVASC) 10 MG tablet [[id number]]   Has the patient contacted their pharmacy? Yes.   (Agent: If no, request that the patient contact the pharmacy for the refill.) (Agent: If yes, when and what did the pharmacy advise?)  Preferred Pharmacy (with phone number or street name): [facility]  Agent: Please be advised that RX refills may take up to 3 business days. We ask that you follow-up with your pharmacy.

## 2018-02-10 NOTE — Telephone Encounter (Signed)
Refill sent for approval by Dr. Drue Novel.

## 2018-03-27 ENCOUNTER — Other Ambulatory Visit: Payer: Self-pay | Admitting: Internal Medicine

## 2018-07-12 ENCOUNTER — Ambulatory Visit: Payer: 59 | Admitting: Internal Medicine

## 2018-07-26 ENCOUNTER — Ambulatory Visit: Payer: 59 | Admitting: Internal Medicine

## 2018-07-26 ENCOUNTER — Encounter: Payer: Self-pay | Admitting: Internal Medicine

## 2018-07-26 VITALS — BP 126/84 | HR 81 | Temp 98.1°F | Resp 16 | Ht 74.0 in | Wt 300.2 lb

## 2018-07-26 DIAGNOSIS — I10 Essential (primary) hypertension: Secondary | ICD-10-CM | POA: Diagnosis not present

## 2018-07-26 DIAGNOSIS — R739 Hyperglycemia, unspecified: Secondary | ICD-10-CM

## 2018-07-26 NOTE — Progress Notes (Signed)
Pre visit review using our clinic review tool, if applicable. No additional management support is needed unless otherwise documented below in the visit note. 

## 2018-07-26 NOTE — Progress Notes (Signed)
Subjective:    Patient ID: Ralph [name], male    DOB: 1981/02/13, 38 y.o.   MRN: [id number]  DOS:  07/26/2018 Type of visit - description: rov Since the last office visit he is feeling well. I noticed weight gain, states he is able to lose weight but often times he "falls of the wagon". No recent ambulatory BPs.  Wt Readings from Last 3 Encounters:  07/26/18 (!) 300 lb 4 oz (136.2 kg)  01/11/18 293 lb 9.6 oz (133.2 kg)  05/25/17 282 lb 6 oz (128.1 kg)     Review of Systems  No chest pain no difficulty breathing No lower extremity edema. Past Medical History:  Diagnosis Date  . Abnormal EKG 11/11/2010  . HTN (hypertension) 11/11/2010  . Hyperglycemia 01/30/2011  . Kidney stone 09/29/2011    Past Surgical History:  Procedure Laterality Date  . NECK SURGERY     cyst?    Social History   Socioeconomic History  . Marital status: Married    Spouse name: Not on file  . Number of children: 5  . Years of education: Not on file  . Highest education level: Not on file  Occupational History  . Occupation: truck Clinical research associate: [employer]  Social Needs  . Financial resource strain: Not on file  . Food insecurity:    Worry: Not on file    Inability: Not on file  . Transportation needs:    Medical: Not on file    Non-medical: Not on file  Tobacco Use  . Smoking status: Former Smoker    Years: 6.00    Types: Cigarettes    Last attempt to quit: 08/18/2010    Years since quitting: 7.9  . Smokeless tobacco: Never Used  Substance and Sexual Activity  . Alcohol use: Yes    Comment: 1-2 drinks liquor a month  . Drug use: No  . Sexual activity: Not on file  Lifestyle  . Physical activity:    Days per week: Not on file    Minutes per session: Not on file  . Stress: Not on file  Relationships  . Social connections:    Talks on phone: Not on file    Gets together: Not on file    Attends religious service: Not on file    Active member of club or organization: Not on  file    Attends meetings of clubs or organizations: Not on file    Relationship status: Not on file  . Intimate partner violence:    Fear of current or ex partner: Not on file    Emotionally abused: Not on file    Physically abused: Not on file    Forced sexual activity: Not on file  Other Topics Concern  . Not on file  Social History Narrative   Works as a Naval architect             Allergies as of 07/26/2018   No Known Allergies     Medication List       Accurate as of July 26, 2018 11:59 PM. Always use your most recent med list.        amLODipine 10 MG tablet Commonly known as:  NORVASC Take 1 tablet (10 mg total) by mouth daily.   losartan 50 MG tablet Commonly known as:  COZAAR Take 1 tablet (50 mg total) by mouth daily.           Objective:   Physical Exam  BP 126/84 (BP Location: Left Arm, Patient Position: Sitting, Cuff Size: Normal)   Pulse 81   Temp 98.1 F (36.7 C) (Oral)   Resp 16   Ht 6\' 2"  (1.88 m)   Wt (!) 300 lb 4 oz (136.2 kg)   SpO2 98%   PF (!) 6 L/min   BMI 38.55 kg/m  General:   Well developed, NAD, BMI noted. HEENT:  Normocephalic . Face symmetric, atraumatic Lungs:  CTA B Normal respiratory effort, no intercostal retractions, no accessory muscle use. Heart: RRR,  no murmur.  No pretibial edema bilaterally  Skin: Not pale. Not jaundice Neurologic:  alert & oriented X3.  Speech normal, gait appropriate for age and unassisted Psych--  Cognition and judgment appear intact.  Cooperative with normal attention span and concentration.  Behavior appropriate. No anxious or depressed appearing.      Assessment     Assessment Prediabetes HTN - self d/c losartan ~ 11-2015 d/t dizziness, ok w/ benicar Morbid obesity Kidney stones   Abnormal EKG, T waves, saw cards 2012, echo (-)  PLAN: Prediabetes: Check A1c HTN: Continue amlodipine, losartan, check BMP Morbid obesity: Long conversation about diet, unfortunately he does not have  time to exercise, as soon as he gets off work, he takes care of his children and his wife goes to work.  Information about the wellness clinic provided. RTC 5 months CPX

## 2018-07-26 NOTE — Patient Instructions (Signed)
GO TO THE LAB : Get the blood work     GO TO THE FRONT DESK Schedule your next appointment   For a physical in 5 months

## 2018-07-27 LAB — BASIC METABOLIC PANEL
BUN: 14 mg/dL (ref 6–23)
CO2: 24 mEq/L (ref 19–32)
Calcium: 9.6 mg/dL (ref 8.4–10.5)
Chloride: 102 mEq/L (ref 96–112)
Creatinine, Ser: 1.03 mg/dL (ref 0.40–1.50)
GFR: 97.47 mL/min (ref >60.00)
Glucose, Bld: 79 mg/dL (ref 70–99)
Potassium: 4 mEq/L (ref 3.5–5.1)
Sodium: 136 mEq/L (ref 135–145)

## 2018-07-27 LAB — HEMOGLOBIN A1C: Hgb A1c MFr Bld: 5.8 % (ref 4.6–6.5)

## 2018-07-27 NOTE — Assessment & Plan Note (Signed)
Prediabetes: Check A1c HTN: Continue amlodipine, losartan, check BMP Morbid obesity: Long conversation about diet, unfortunately he does not have time to exercise, as soon as he gets off work, he takes care of his children and his wife goes to work.  Information about the wellness clinic provided. RTC 5 months CPX

## 2018-09-16 ENCOUNTER — Other Ambulatory Visit: Payer: Self-pay | Admitting: Internal Medicine

## 2019-01-03 ENCOUNTER — Encounter: Payer: 59 | Admitting: Internal Medicine

## 2019-01-03 DIAGNOSIS — Z0289 Encounter for other administrative examinations: Secondary | ICD-10-CM

## 2019-02-25 ENCOUNTER — Other Ambulatory Visit: Payer: Self-pay | Admitting: Internal Medicine

## 2019-03-24 ENCOUNTER — Telehealth: Payer: Self-pay | Admitting: Internal Medicine

## 2019-03-24 NOTE — Telephone Encounter (Signed)
SWP and informed him the No Show Fee from [facility] 01/03/19 will be removed.

## 2019-04-25 ENCOUNTER — Other Ambulatory Visit: Payer: Self-pay | Admitting: Internal Medicine

## 2019-04-25 MED ORDER — AMLODIPINE BESYLATE 10 MG PO TABS: 10.0000 mg | ORAL_TABLET | Freq: Every day | ORAL | 0 refills | Status: DC

## 2019-05-09 ENCOUNTER — Other Ambulatory Visit: Payer: Self-pay

## 2019-05-09 ENCOUNTER — Ambulatory Visit (INDEPENDENT_AMBULATORY_CARE_PROVIDER_SITE_OTHER): Payer: 59 | Admitting: Internal Medicine

## 2019-05-09 ENCOUNTER — Encounter: Payer: Self-pay | Admitting: Internal Medicine

## 2019-05-09 VITALS — BP 136/99 | HR 96 | Temp 97.4°F | Resp 16 | Ht 74.0 in | Wt 313.4 lb

## 2019-05-09 DIAGNOSIS — Z0000 Encounter for general adult medical examination without abnormal findings: Secondary | ICD-10-CM | POA: Diagnosis not present

## 2019-05-09 DIAGNOSIS — I10 Essential (primary) hypertension: Secondary | ICD-10-CM

## 2019-05-09 DIAGNOSIS — R739 Hyperglycemia, unspecified: Secondary | ICD-10-CM | POA: Diagnosis not present

## 2019-05-09 MED ORDER — AMLODIPINE BESYLATE 10 MG PO TABS: 10.0000 mg | ORAL_TABLET | Freq: Every day | ORAL | 1 refills | Status: DC

## 2019-05-09 MED ORDER — LOSARTAN POTASSIUM 50 MG PO TABS: 50.0000 mg | ORAL_TABLET | Freq: Every day | ORAL | 1 refills | Status: DC

## 2019-05-09 NOTE — Assessment & Plan Note (Signed)
--  Td 2012, had a flu shot  --Labs: CMP, FLP, CBC, A1c. --Diet exercise: Discussed

## 2019-05-09 NOTE — Patient Instructions (Signed)
GO TO THE LAB : Get the blood work     GO TO THE FRONT DESK Schedule your next appointment   for a checkup in 6 months  Check your blood pressure weekly or twice a month BP GOAL is between 110/65 and  135/85. If it is consistently higher or lower, let me know

## 2019-05-09 NOTE — Progress Notes (Signed)
Subjective:    Patient ID: Ralph [name], male    DOB: August 31, 1980, 38 y.o.   MRN: [id number]  DOS:  05/09/2019 Type of visit - description: CPX In general feels well has no concerns.  Review of symptoms negative.  Doing well emotionally  BP Readings from Last 3 Encounters:  05/09/19 (!) 136/99  07/26/18 126/84  01/11/18 126/90    Review of Systems  A 14 point review of systems is negative   Past Medical History:  Diagnosis Date  . Abnormal EKG 11/11/2010  . HTN (hypertension) 11/11/2010  . Hyperglycemia 01/30/2011  . Kidney stone 09/29/2011    Past Surgical History:  Procedure Laterality Date  . NECK SURGERY     cyst?    Social History   Socioeconomic History  . Marital status: Married    Spouse name: Not on file  . Number of children: 5  . Years of education: Not on file  . Highest education level: Not on file  Occupational History  . Occupation: truck Consulting civil engineer: [employer]  Tobacco Use  . Smoking status: Former Smoker    Years: 6.00    Types: Cigarettes    Quit date: 08/18/2010    Years since quitting: 8.7  . Smokeless tobacco: Never Used  Substance and Sexual Activity  . Alcohol use: Not Currently    Comment:    . Drug use: No  . Sexual activity: Not on file  Other Topics Concern  . Not on file  Social History Narrative   Works as a Dentist Strain:   . Difficulty of Paying Living Expenses: Not on file  Food Insecurity:   . Worried About Charity fundraiser in the Last Year: Not on file  . Ran Out of Food in the Last Year: Not on file  Transportation Needs:   . Lack of Transportation (Medical): Not on file  . Lack of Transportation (Non-Medical): Not on file  Physical Activity:   . Days of Exercise per Week: Not on file  . Minutes of Exercise per Session: Not on file  Stress:   . Feeling of Stress : Not on file  Social Connections:   . Frequency of Communication with  Friends and Family: Not on file  . Frequency of Social Gatherings with Friends and Family: Not on file  . Attends Religious Services: Not on file  . Active Member of Clubs or Organizations: Not on file  . Attends Archivist Meetings: Not on file  . Marital Status: Not on file  Intimate Partner Violence:   . Fear of Current or Ex-Partner: Not on file  . Emotionally Abused: Not on file  . Physically Abused: Not on file  . Sexually Abused: Not on file     Family History  Problem Relation Age of Onset  . Diabetes Father   . Hypertension Maternal Grandfather   . Diabetes Paternal Grandmother   . Cancer Neg Hx   . Heart disease Neg Hx      Allergies as of 05/09/2019   No Known Allergies     Medication List       Accurate as of May 09, 2019 11:59 PM. If you have any questions, ask your nurse or doctor.        amLODipine 10 MG tablet Commonly known as: NORVASC Take 1 tablet (10 mg total) by mouth daily.  losartan 50 MG tablet Commonly known as: COZAAR Take 1 tablet (50 mg total) by mouth daily.           Objective:   Physical Exam BP (!) 136/99 (BP Location: Left Arm)   Pulse 96   Temp (!) 97.4 F (36.3 C) (Temporal)   Resp 16   Ht 6\' 2"  (1.88 m)   Wt (!) 313 lb 6 oz (142.1 kg)   SpO2 99%   BMI 40.23 kg/m   General: Well developed, NAD, BMI noted Neck: No  thyromegaly  HEENT:  Normocephalic . Face symmetric, atraumatic Lungs:  CTA B Normal respiratory effort, no intercostal retractions, no accessory muscle use. Heart: RRR,  no murmur.  No pretibial edema bilaterally  Abdomen:  Not distended, soft, non-tender. No rebound or rigidity.   Skin: Exposed areas without rash. Not pale. Not jaundice Neurologic:  alert & oriented X3.  Speech normal, gait appropriate for age and unassisted Strength symmetric and appropriate for age.  Psych: Cognition and judgment appear intact.  Cooperative with normal attention span and concentration.    Behavior appropriate. No anxious or depressed appearing.     Assessment      Assessment Prediabetes HTN - self d/c losartan ~ 11-2015 , back on it as off 04-2019 Morbid obesity Kidney stones   Abnormal EKG, T waves, saw cards 2012, echo (-)  PLAN: Here for CPX HTN: Initial BP was 147/92, recheck: 136/99 currently on amlodipine 10 mg and losartan 50. Recommend ambulatory BPs, no change for now EKG today: NSR.  Has T wave inversions, no change from previous. Morbid obesity: Counseled about diet and exercise RTC 6 months   This visit occurred during the SARS-CoV-2 public health emergency.  Safety protocols were in place, including screening questions prior to the visit, additional usage of staff PPE, and extensive cleaning of exam room while observing appropriate contact time as indicated for disinfecting solutions.

## 2019-05-09 NOTE — Progress Notes (Signed)
Pre visit review using our clinic review tool, if applicable. No additional management support is needed unless otherwise documented below in the visit note. 

## 2019-05-10 ENCOUNTER — Encounter: Payer: 59 | Admitting: Internal Medicine

## 2019-05-10 LAB — CBC WITH DIFFERENTIAL/PLATELET
Basophils Absolute: 0.1 10*3/uL (ref 0.0–0.1)
Basophils Relative: 1.1 % (ref 0.0–3.0)
Eosinophils Absolute: 0.3 10*3/uL (ref 0.0–0.7)
Eosinophils Relative: 3 % (ref 0.0–5.0)
HCT: 46.3 % (ref 39.0–52.0)
Hemoglobin: 15.1 g/dL (ref 13.0–17.0)
Lymphocytes Relative: 32.1 % (ref 12.0–46.0)
Lymphs Abs: 2.7 10*3/uL (ref 0.7–4.0)
MCHC: 32.7 g/dL (ref 30.0–36.0)
MCV: 80.9 fl (ref 78.0–100.0)
Monocytes Absolute: 0.8 10*3/uL (ref 0.1–1.0)
Monocytes Relative: 9.6 % (ref 3.0–12.0)
Neutro Abs: 4.5 10*3/uL (ref 1.4–7.7)
Neutrophils Relative %: 54.2 % (ref 43.0–77.0)
Platelets: 321 10*3/uL (ref 150.0–400.0)
RBC: 5.72 Mil/uL (ref 4.22–5.81)
RDW: 15.1 % (ref 11.5–15.5)
WBC: 8.3 10*3/uL (ref 4.0–10.5)

## 2019-05-10 LAB — LIPID PANEL
Cholesterol: 189 mg/dL (ref 0–200)
HDL: 43.3 mg/dL (ref >39.00)
LDL Cholesterol: 122 mg/dL — ABNORMAL HIGH (ref 0–99)
NonHDL: 146
Total CHOL/HDL Ratio: 4
Triglycerides: 120 mg/dL (ref 0.0–149.0)
VLDL: 24 mg/dL (ref 0.0–40.0)

## 2019-05-10 LAB — COMPREHENSIVE METABOLIC PANEL
ALT: 60 U/L — ABNORMAL HIGH (ref 0–53)
AST: 31 U/L (ref 0–37)
Albumin: 4.5 g/dL (ref 3.5–5.2)
Alkaline Phosphatase: 55 U/L (ref 39–117)
BUN: 11 mg/dL (ref 6–23)
CO2: 29 mEq/L (ref 19–32)
Calcium: 9.6 mg/dL (ref 8.4–10.5)
Chloride: 102 mEq/L (ref 96–112)
Creatinine, Ser: 1.08 mg/dL (ref 0.40–1.50)
GFR: 91.91 mL/min (ref >60.00)
Glucose, Bld: 113 mg/dL — ABNORMAL HIGH (ref 70–99)
Potassium: 3.7 mEq/L (ref 3.5–5.1)
Sodium: 137 mEq/L (ref 135–145)
Total Bilirubin: 0.4 mg/dL (ref 0.2–1.2)
Total Protein: 7.3 g/dL (ref 6.0–8.3)

## 2019-05-10 LAB — HEMOGLOBIN A1C: Hgb A1c MFr Bld: 6 % (ref 4.6–6.5)

## 2019-05-10 NOTE — Assessment & Plan Note (Addendum)
Here for CPX HTN: Initial BP was 147/92, recheck: 136/99 currently on amlodipine 10 mg and losartan 50. Recommend ambulatory BPs, no change for now EKG today: NSR.  Has T wave inversions, no change from previous. Morbid obesity: Counseled about diet and exercise RTC 6 months

## 2019-09-26 ENCOUNTER — Encounter: Payer: Self-pay | Admitting: Internal Medicine

## 2019-09-27 MED ORDER — LOSARTAN POTASSIUM 50 MG PO TABS: 50.0000 mg | ORAL_TABLET | Freq: Every day | ORAL | 0 refills | Status: DC

## 2019-09-27 MED ORDER — AMLODIPINE BESYLATE 10 MG PO TABS: 10.0000 mg | ORAL_TABLET | Freq: Every day | ORAL | 0 refills | Status: DC

## 2019-11-07 ENCOUNTER — Ambulatory Visit: Payer: 59 | Admitting: Internal Medicine

## 2019-12-05 ENCOUNTER — Encounter: Payer: Self-pay | Admitting: Internal Medicine

## 2019-12-05 ENCOUNTER — Other Ambulatory Visit: Payer: Self-pay

## 2019-12-05 ENCOUNTER — Ambulatory Visit: Payer: 59 | Admitting: Internal Medicine

## 2019-12-05 VITALS — BP 121/85 | HR 78 | Temp 98.1°F | Resp 18 | Ht 74.0 in | Wt 282.5 lb

## 2019-12-05 DIAGNOSIS — R739 Hyperglycemia, unspecified: Secondary | ICD-10-CM | POA: Diagnosis not present

## 2019-12-05 DIAGNOSIS — I10 Essential (primary) hypertension: Secondary | ICD-10-CM | POA: Diagnosis not present

## 2019-12-05 LAB — HEMOGLOBIN A1C: Hgb A1c MFr Bld: 5.6 % (ref 4.6–6.5)

## 2019-12-05 LAB — BASIC METABOLIC PANEL
BUN: 13 mg/dL (ref 6–23)
CO2: 24 mEq/L (ref 19–32)
Calcium: 9.7 mg/dL (ref 8.4–10.5)
Chloride: 105 mEq/L (ref 96–112)
Creatinine, Ser: 1.05 mg/dL (ref 0.40–1.50)
GFR: 94.67 mL/min (ref >60.00)
Glucose, Bld: 97 mg/dL (ref 70–99)
Potassium: 4.1 mEq/L (ref 3.5–5.1)
Sodium: 137 mEq/L (ref 135–145)

## 2019-12-05 NOTE — Patient Instructions (Addendum)
COVID-19 Vaccine Information can be found at: [email or web address] For questions related to vaccine distribution or appointments, please email [email or web address] or call [phone].   Continue checking your blood pressure BP GOAL is between 110/65 and  135/85. If it is consistently higher or lower, let me know  You are doing great while your diet!  GO TO THE LAB : Get the blood work     GO TO THE FRONT DESK, PLEASE SCHEDULE YOUR APPOINTMENTS Come back for a physical exam by December 2021

## 2019-12-05 NOTE — Progress Notes (Signed)
   Subjective:    Patient ID: Ralph [name], male    DOB: 11-Aug-1980, 39 y.o.   MRN: [id number]  DOS:  12/05/2019 Type of visit - description: Routine follow Since the last office visit he is doing great. Doing a diet for the last few weeks and has lost significant amount of weight.  BP Readings from Last 3 Encounters:  12/05/19 121/85  05/09/19 (!) 136/99  07/26/18 126/84   Wt Readings from Last 3 Encounters:  12/05/19 282 lb 8 oz (128.1 kg)  05/09/19 (!) 313 lb 6 oz (142.1 kg)  07/26/18 (!) 300 lb 4 oz (136.2 kg)      Review of Systems Denies chest pain or difficulty breathing, no lower extremity edema  Past Medical History:  Diagnosis Date  . Abnormal EKG 11/11/2010  . HTN (hypertension) 11/11/2010  . Hyperglycemia 01/30/2011  . Kidney stone 09/29/2011    Past Surgical History:  Procedure Laterality Date  . NECK SURGERY     cyst?    Allergies as of 12/05/2019   No Known Allergies     Medication List       Accurate as of December 05, 2019  8:49 AM. If you have any questions, ask your nurse or doctor.        amLODipine 10 MG tablet Commonly known as: NORVASC Take 1 tablet (10 mg total) by mouth daily.   losartan 50 MG tablet Commonly known as: COZAAR Take 1 tablet (50 mg total) by mouth daily.          Objective:   Physical Exam BP 121/85 (BP Location: Left Arm, Patient Position: Sitting, Cuff Size: Normal)   Pulse 78   Temp 98.1 F (36.7 C) (Oral)   Resp 18   Ht 6\' 2"  (1.88 m)   Wt 282 lb 8 oz (128.1 kg)   SpO2 95%   BMI 36.27 kg/m  General:   Well developed, NAD, BMI noted. HEENT:  Normocephalic . Face symmetric, atraumatic Lungs:  CTA B Normal respiratory effort, no intercostal retractions, no accessory muscle use. Heart: RRR,  no murmur.  Lower extremities: no pretibial edema bilaterally  Skin: Not pale. Not jaundice Neurologic:  alert & oriented X3.  Speech normal, gait appropriate for age and unassisted Psych--  Cognition and  judgment appear intact.  Cooperative with normal attention span and concentration.  Behavior appropriate. No anxious or depressed appearing.      Assessment    Assessment Prediabetes HTN - self d/c losartan ~ 11-2015 , back on it as off 04-2019 Morbid obesity Kidney stones   Abnormal EKG, T waves, saw cards 2012, echo (-)  PLAN: Prediabetes: Doing great with diet,  is taking some supplements to substitute his meals, advised to transition to healthy diet after the program ends.  Check A1c HTN: Ambulatory BPs in the 120s, continue amlodipine, losartan, check a BMP Morbid obesity: See above comments, doing well Cholesterol: Last FLP was ok, at some point he might benefit from medication (even if cholesterol not too high) given order comorbidities.  Patient aware. Patient education: Hesitant to proceed with Covid vaccination, extensively educated about Pro>>cons. RTC 04/2020 CPX   This visit occurred during the SARS-CoV-2 public health emergency.  Safety protocols were in place, including screening questions prior to the visit, additional usage of staff PPE, and extensive cleaning of exam room while observing appropriate contact time as indicated for disinfecting solutions.

## 2019-12-05 NOTE — Assessment & Plan Note (Signed)
Prediabetes: Doing great with diet,  is taking some supplements to substitute his meals, advised to transition to healthy diet after the program ends.  Check A1c HTN: Ambulatory BPs in the 120s, continue amlodipine, losartan, check a BMP Morbid obesity: See above comments, doing well Cholesterol: Last FLP was ok, at some point he might benefit from medication (even if cholesterol not too high) given order comorbidities.  Patient aware. Patient education: Hesitant to proceed with Covid vaccination, extensively educated about Pro>>cons. RTC 04/2020 CPX

## 2019-12-05 NOTE — Progress Notes (Signed)
Pre visit review using our clinic review tool, if applicable. No additional management support is needed unless otherwise documented below in the visit note. 

## 2020-04-02 ENCOUNTER — Other Ambulatory Visit: Payer: Self-pay | Admitting: Internal Medicine

## 2020-04-02 MED ORDER — AMLODIPINE BESYLATE 10 MG PO TABS: 10.0000 mg | ORAL_TABLET | Freq: Every day | ORAL | 0 refills | Status: DC

## 2020-04-02 MED ORDER — LOSARTAN POTASSIUM 50 MG PO TABS: 50.0000 mg | ORAL_TABLET | Freq: Every day | ORAL | 0 refills | Status: DC

## 2020-05-03 ENCOUNTER — Emergency Department (HOSPITAL_BASED_OUTPATIENT_CLINIC_OR_DEPARTMENT_OTHER): Payer: Worker's Compensation

## 2020-05-03 ENCOUNTER — Other Ambulatory Visit: Payer: Self-pay

## 2020-05-03 ENCOUNTER — Encounter (HOSPITAL_BASED_OUTPATIENT_CLINIC_OR_DEPARTMENT_OTHER): Payer: Self-pay | Admitting: *Deleted

## 2020-05-03 ENCOUNTER — Emergency Department (HOSPITAL_BASED_OUTPATIENT_CLINIC_OR_DEPARTMENT_OTHER)
Admission: EM | Admit: 2020-05-03 | Discharge: 2020-05-03 | Disposition: A | Discharge status: Home / Self Care | Payer: Worker's Compensation | Attending: Emergency Medicine | Admitting: Emergency Medicine

## 2020-05-03 DIAGNOSIS — Y990 Civilian activity done for income or pay: Secondary | ICD-10-CM | POA: Diagnosis not present

## 2020-05-03 DIAGNOSIS — I10 Essential (primary) hypertension: Secondary | ICD-10-CM | POA: Diagnosis not present

## 2020-05-03 DIAGNOSIS — W228XXA Striking against or struck by other objects, initial encounter: Secondary | ICD-10-CM | POA: Diagnosis not present

## 2020-05-03 DIAGNOSIS — S93402A Sprain of unspecified ligament of left ankle, initial encounter: Secondary | ICD-10-CM | POA: Diagnosis not present

## 2020-05-03 DIAGNOSIS — S99912A Unspecified injury of left ankle, initial encounter: Secondary | ICD-10-CM | POA: Diagnosis present

## 2020-05-03 DIAGNOSIS — Z87891 Personal history of nicotine dependence: Secondary | ICD-10-CM | POA: Diagnosis not present

## 2020-05-03 DIAGNOSIS — Z79899 Other long term (current) drug therapy: Secondary | ICD-10-CM | POA: Insufficient documentation

## 2020-05-03 MED ORDER — IBUPROFEN 600 MG PO TABS: 600.0000 mg | ORAL_TABLET | Freq: Four times a day (QID) | ORAL | 0 refills | Status: DC

## 2020-05-03 NOTE — ED Triage Notes (Signed)
Hit by the fork lift this morning.

## 2020-05-03 NOTE — ED Provider Notes (Signed)
[facility] EMERGENCY DEPARTMENT Provider Note   CSN: [id number] Arrival date & time: 05/03/20  9892     History Chief Complaint  Patient presents with  . Foot Pain    Swollen left ankle    Ralph [name] is a 39 y.o. male.  HPI    39 year old male comes in a chief complaint of ankle injury.  Patient reports that he was at work, and a pallet from a fork lift struck him on the lateral side of his left ankle.  His ankle thereafter got stuck under a wheel and was inverted in the process.  He is able to bear weight but is having mild discomfort with it.  No history of injury to that ankle before.  No numbness, tingling.  Past Medical History:  Diagnosis Date  . Abnormal EKG 11/11/2010  . HTN (hypertension) 11/11/2010  . Hyperglycemia 01/30/2011  . Kidney stone 09/29/2011    Patient Active Problem List   Diagnosis Date Noted  . PCP NOTES >>>>>>>>>>>>>>>>>>>. 12/24/2015  . Cyst of ear canal 01/05/2012  . Kidney stone 09/29/2011  . Hyperglycemia 01/30/2011  . General medical examination 11/11/2010  . HTN (hypertension) 11/11/2010  . Morbid obesity (HCC) 11/11/2010  . Cutaneous skin tags 11/11/2010  . Abnormal EKG 11/11/2010  . Palpitations 11/11/2010    Past Surgical History:  Procedure Laterality Date  . NECK SURGERY     cyst?       Family History  Problem Relation Age of Onset  . Diabetes Father   . Hypertension Maternal Grandfather   . Diabetes Paternal Grandmother   . Cancer Neg Hx   . Heart disease Neg Hx     Social History   Tobacco Use  . Smoking status: Former Smoker    Years: 6.00    Types: Cigarettes    Quit date: 08/18/2010    Years since quitting: 9.7  . Smokeless tobacco: Never Used  Substance Use Topics  . Alcohol use: Not Currently    Comment:    . Drug use: No    Home Medications Prior to Admission medications   Medication Sig Start Date End Date Taking? Authorizing Provider  amLODipine (NORVASC) 10 MG tablet Take 1 tablet  (10 mg total) by mouth daily. 04/02/20  Yes Paz, Nolon Rod, MD  losartan (COZAAR) 50 MG tablet Take 1 tablet (50 mg total) by mouth daily. 04/02/20  Yes Paz, Nolon Rod, MD  ibuprofen (ADVIL) 600 MG tablet Take 1 tablet (600 mg total) by mouth 4 (four) times daily. 05/03/20   Derwood Kaplan, MD    Allergies    Patient has no known allergies.  Review of Systems   Review of Systems  Constitutional: Positive for activity change.  Musculoskeletal: Positive for arthralgias.  Skin: Negative for rash and wound.  Neurological: Negative for numbness.    Physical Exam Updated Vital Signs BP (!) 141/99 (BP Location: Right Arm)   Pulse 90   Temp 98.6 F (37 C) (Oral)   Resp 18   Ht 5\' 10"  (1.778 m)   Wt 131.5 kg   SpO2 97%   BMI 41.61 kg/m   Physical Exam Vitals and nursing note reviewed.  Constitutional:      Appearance: He is well-developed.  HENT:     Head: Atraumatic.  Cardiovascular:     Rate and Rhythm: Normal rate.  Pulmonary:     Effort: Pulmonary effort is normal.  Musculoskeletal:        General: Swelling and tenderness  present. No deformity.     Cervical back: Neck supple.     Comments: Patient has edema over the left lateral ankle with tenderness around the lateral malleoli.  Tenderness with inversion noted.  No instability noted.  Skin:    General: Skin is warm.  Neurological:     Mental Status: He is alert and oriented to person, place, and time.     ED Results / Procedures / Treatments   Labs (all labs ordered are listed, but only abnormal results are displayed) Labs Reviewed - No data to display  EKG None  Radiology DG Ankle Complete Left  Result Date: 05/03/2020 CLINICAL DATA:  Pain and swelling after trauma EXAM: LEFT ANKLE COMPLETE - 3+ VIEW COMPARISON:  None. FINDINGS: Frontal, oblique, and lateral views were obtained. There is soft tissue swelling. No appreciable fracture or joint effusion. No appreciable joint space narrowing or erosion. Ankle mortise  appears intact. IMPRESSION: Soft tissue swelling. No evident fracture or arthropathy. Ankle mortise appears intact. Electronically Signed   By: Bretta Bang III M.D.   On: 05/03/2020 08:08    Procedures Procedures (including critical care time)  Medications Ordered in ED Medications - No data to display  ED Course  I have reviewed the triage vital signs and the nursing notes.  Pertinent labs & imaging results that were available during my care of the patient were reviewed by me and considered in my medical decision making (see chart for details).  Clinical Course as of 05/03/20 0839  Thu May 03, 2020  6440 DG Ankle Complete Left X-rays reviewed by me independently.  No signs of fracture [AN]    Clinical Course User Index [AN] Derwood Kaplan, MD   MDM Rules/Calculators/A&P                         39 year old male comes in a chief complaint of left lateral ankle pain.  He had blunt trauma to that side followed by inversion injury.  Patient able to bear weight.  Concerns for possible nondisplaced fibular fracture versus moderate ankle sprain.  X-rays ordered.   Final Clinical Impression(s) / ED Diagnoses Final diagnoses:  Sprain of left ankle, unspecified ligament, initial encounter    Rx / DC Orders ED Discharge Orders         Ordered    ibuprofen (ADVIL) 600 MG tablet  4 times daily        05/03/20 0806           Derwood Kaplan, MD 05/03/20 [phone]

## 2020-05-03 NOTE — Discharge Instructions (Addendum)
Read the RICE instructions that are provided. Take ibuprofen every 6 hours, ice the ankle 4 times a day for 10 minutes.  Keep the ankle brace on at all times.  Avoid unnecessary walking.  Follow-up with the orthopedic doctor as requested.

## 2020-05-07 ENCOUNTER — Ambulatory Visit: Payer: 59 | Admitting: Internal Medicine

## 2020-05-07 ENCOUNTER — Encounter: Payer: Self-pay | Admitting: Internal Medicine

## 2020-05-07 DIAGNOSIS — Z0289 Encounter for other administrative examinations: Secondary | ICD-10-CM

## 2020-05-23 ENCOUNTER — Telehealth: Payer: Self-pay | Admitting: Family Medicine

## 2020-05-23 NOTE — Telephone Encounter (Signed)
Called pt to schedule ED follow up for WC (foot injury) -- pt has already been treated by [facility] provider-- appt declined.  --glh

## 2020-07-26 ENCOUNTER — Encounter: Payer: Self-pay | Admitting: Internal Medicine

## 2021-01-29 ENCOUNTER — Other Ambulatory Visit: Payer: Self-pay | Admitting: Internal Medicine

## 2021-01-30 MED ORDER — AMLODIPINE BESYLATE 10 MG PO TABS: 10.0000 mg | ORAL_TABLET | Freq: Every day | ORAL | 0 refills | Status: DC

## 2021-01-30 MED ORDER — LOSARTAN POTASSIUM 50 MG PO TABS: 50.0000 mg | ORAL_TABLET | Freq: Every day | ORAL | 0 refills | Status: DC

## 2021-02-13 ENCOUNTER — Encounter: Payer: 59 | Admitting: Internal Medicine

## 2021-05-08 ENCOUNTER — Encounter: Payer: Self-pay | Admitting: Internal Medicine

## 2021-05-08 ENCOUNTER — Ambulatory Visit (INDEPENDENT_AMBULATORY_CARE_PROVIDER_SITE_OTHER): Payer: 59 | Admitting: Internal Medicine

## 2021-05-08 VITALS — BP 126/88 | HR 106 | Temp 98.3°F | Resp 16 | Ht 74.0 in | Wt 322.1 lb

## 2021-05-08 DIAGNOSIS — I10 Essential (primary) hypertension: Secondary | ICD-10-CM

## 2021-05-08 DIAGNOSIS — Z1159 Encounter for screening for other viral diseases: Secondary | ICD-10-CM | POA: Diagnosis not present

## 2021-05-08 DIAGNOSIS — Z23 Encounter for immunization: Secondary | ICD-10-CM

## 2021-05-08 DIAGNOSIS — R739 Hyperglycemia, unspecified: Secondary | ICD-10-CM | POA: Diagnosis not present

## 2021-05-08 DIAGNOSIS — Z0000 Encounter for general adult medical examination without abnormal findings: Secondary | ICD-10-CM

## 2021-05-08 MED ORDER — LOSARTAN POTASSIUM 50 MG PO TABS: 50.0000 mg | ORAL_TABLET | Freq: Every day | ORAL | 3 refills | Status: DC

## 2021-05-08 MED ORDER — AMLODIPINE BESYLATE 10 MG PO TABS: 10.0000 mg | ORAL_TABLET | Freq: Every day | ORAL | 3 refills | Status: DC

## 2021-05-08 NOTE — Progress Notes (Signed)
Subjective:    Patient ID: Ralph [name], male    DOB: 07-03-1980, 40 y.o.   MRN: [id number]  DOS:  05/08/2021 Type of visit - description: cpx Since the last office visit, was diagnosed with sleep apnea, good compliance with CPAP. Feels well, denies fatigue or sleepiness.  Review of Systems    A 14 point review of systems is negative    Past Medical History:  Diagnosis Date   Abnormal EKG 11/11/2010   HTN (hypertension) 11/11/2010   Hyperglycemia 01/30/2011   Kidney stone 09/29/2011    Past Surgical History:  Procedure Laterality Date   NECK SURGERY     cyst?   Social History   Socioeconomic History   Marital status: Married    Spouse name: Not on file   Number of children: 5   Years of education: Not on file   Highest education level: Not on file  Occupational History   Occupation: truck driver , own business  Tobacco Use   Smoking status: Former    Years: 6.00    Types: Cigarettes    Quit date: 08/18/2010    Years since quitting: 10.7   Smokeless tobacco: Never  Substance and Sexual Activity   Alcohol use: Not Currently    Comment:     Drug use: No   Sexual activity: Not on file  Other Topics Concern   Not on file  Social History Narrative   Works as a Naval architect      Social Determinants of Corporate investment banker Strain: Not on file  Food Insecurity: Not on file  Transportation Needs: Not on file  Physical Activity: Not on file  Stress: Not on file  Social Connections: Not on file  Intimate Partner Violence: Not on file     Allergies as of 05/08/2021   No Known Allergies      Medication List        Accurate as of May 08, 2021 11:59 PM. If you have any questions, ask your nurse or doctor.          STOP taking these medications    ibuprofen 600 MG tablet Commonly known as: ADVIL Stopped by: Willow Ora, MD       TAKE these medications    amLODipine 10 MG tablet Commonly known as: NORVASC Take 1 tablet (10 mg total)  by mouth daily.   losartan 50 MG tablet Commonly known as: COZAAR Take 1 tablet (50 mg total) by mouth daily.           Objective:   Physical Exam BP 126/88 (BP Location: Left Arm, Patient Position: Sitting, Cuff Size: Normal)    Pulse (!) 106    Temp 98.3 F (36.8 C) (Oral)    Resp 16    Ht 6\' 2"  (1.88 m)    Wt (!) 322 lb 2 oz (146.1 kg)    SpO2 97%    BMI 41.36 kg/m  General: Well developed, NAD, BMI noted Neck: No  thyromegaly  HEENT:  Normocephalic . Face symmetric, atraumatic Lungs:  CTA B Normal respiratory effort, no intercostal retractions, no accessory muscle use. Heart: RRR,  no murmur.  Abdomen:  Not distended, soft, non-tender. No rebound or rigidity.   Lower extremities: no pretibial edema bilaterally  Skin: Exposed areas without rash. Not pale. Not jaundice Neurologic:  alert & oriented X3.  Speech normal, gait appropriate for age and unassisted Strength symmetric and appropriate for age.  Psych: Cognition and judgment appear  intact.  Cooperative with normal attention span and concentration.  Behavior appropriate. No anxious or depressed appearing.     Assessment     Assessment Prediabetes HTN - self d/c losartan ~ 11-2015 , back on it as off 04-2019 Morbid obesity Kidney stones   Abnormal EKG, T waves, saw cards 2012, echo (-) OSA - on CPAP   PLAN: Here for CPX Prediabetes: Check A1c HTN: On amlodipine, losartan, BP today is okay, at home is sometimes in the 140s.  Recommend to check ambulatory BPs, see AVS Morbid obesity: Extensive discussion, strongly recommend a structured diet.  Weight watchers?.  Wellness clinic information provided. OSA: New diagnosis since the last time I saw him, managed elsewhere, uses a CPAP. RTC 1 year.   This visit occurred during the SARS-CoV-2 public health emergency.  Safety protocols were in place, including screening questions prior to the visit, additional usage of staff PPE, and extensive cleaning of exam  room while observing appropriate contact time as indicated for disinfecting solutions.

## 2021-05-08 NOTE — Patient Instructions (Signed)
Check the  blood pressure regularly, twice a month. BP GOAL is between 110/65 and  135/85. If it is consistently higher or lower, let me know  Consider joining weight watchers  GO TO THE LAB : Get the blood work     GO TO THE FRONT DESK, PLEASE SCHEDULE YOUR APPOINTMENTS Come back for a checkup in 1 year.  Sooner depending on results.

## 2021-05-09 ENCOUNTER — Encounter: Payer: Self-pay | Admitting: Internal Medicine

## 2021-05-09 LAB — CBC WITH DIFFERENTIAL/PLATELET
Basophils Absolute: 0.1 10*3/uL (ref 0.0–0.1)
Basophils Relative: 1.1 % (ref 0.0–3.0)
Eosinophils Absolute: 0.4 10*3/uL (ref 0.0–0.7)
Eosinophils Relative: 5.4 % — ABNORMAL HIGH (ref 0.0–5.0)
HCT: 44.8 % (ref 39.0–52.0)
Hemoglobin: 14.7 g/dL (ref 13.0–17.0)
Lymphocytes Relative: 30.7 % (ref 12.0–46.0)
Lymphs Abs: 2.3 10*3/uL (ref 0.7–4.0)
MCHC: 32.8 g/dL (ref 30.0–36.0)
MCV: 80.7 fl (ref 78.0–100.0)
Monocytes Absolute: 0.8 10*3/uL (ref 0.1–1.0)
Monocytes Relative: 10.6 % (ref 3.0–12.0)
Neutro Abs: 3.9 10*3/uL (ref 1.4–7.7)
Neutrophils Relative %: 52.2 % (ref 43.0–77.0)
Platelets: 291 10*3/uL (ref 150.0–400.0)
RBC: 5.55 Mil/uL (ref 4.22–5.81)
RDW: 15.7 % — ABNORMAL HIGH (ref 11.5–15.5)
WBC: 7.5 10*3/uL (ref 4.0–10.5)

## 2021-05-09 LAB — COMPREHENSIVE METABOLIC PANEL
ALT: 74 U/L — ABNORMAL HIGH (ref 0–53)
AST: 35 U/L (ref 0–37)
Albumin: 4.4 g/dL (ref 3.5–5.2)
Alkaline Phosphatase: 56 U/L (ref 39–117)
BUN: 9 mg/dL (ref 6–23)
CO2: 26 mEq/L (ref 19–32)
Calcium: 9.6 mg/dL (ref 8.4–10.5)
Chloride: 104 mEq/L (ref 96–112)
Creatinine, Ser: 1.06 mg/dL (ref 0.40–1.50)
GFR: 87.24 mL/min (ref >60.00)
Glucose, Bld: 88 mg/dL (ref 70–99)
Potassium: 3.9 mEq/L (ref 3.5–5.1)
Sodium: 138 mEq/L (ref 135–145)
Total Bilirubin: 0.6 mg/dL (ref 0.2–1.2)
Total Protein: 7.1 g/dL (ref 6.0–8.3)

## 2021-05-09 LAB — LIPID PANEL
Cholesterol: 215 mg/dL — ABNORMAL HIGH (ref 0–200)
HDL: 44.3 mg/dL (ref >39.00)
LDL Cholesterol: 143 mg/dL — ABNORMAL HIGH (ref 0–99)
NonHDL: 170.89
Total CHOL/HDL Ratio: 5
Triglycerides: 139 mg/dL (ref 0.0–149.0)
VLDL: 27.8 mg/dL (ref 0.0–40.0)

## 2021-05-09 LAB — HEMOGLOBIN A1C: Hgb A1c MFr Bld: 6.7 % — ABNORMAL HIGH (ref 4.6–6.5)

## 2021-05-09 LAB — TSH: TSH: 2.08 u[IU]/mL (ref 0.35–5.50)

## 2021-05-09 NOTE — Assessment & Plan Note (Signed)
--  Td 2012, booster today - Covid 19 booster rec - PNM: Discuss at the next opportunity - flu shot today --Labs: CMP, FLP, CBC, A1c, TSH.  Hep C. --Diet exercise: Discussed

## 2021-05-09 NOTE — Assessment & Plan Note (Signed)
Here for CPX Prediabetes: Check A1c HTN: On amlodipine, losartan, BP today is okay, at home is sometimes in the 140s.  Recommend to check ambulatory BPs, see AVS Morbid obesity: Extensive discussion, strongly recommend a structured diet.  Weight watchers?.  Wellness clinic information provided. OSA: New diagnosis since the last time I saw him, managed elsewhere, uses a CPAP. RTC 1 year.

## 2021-05-10 LAB — HEPATITIS C ANTIBODY
Hepatitis C Ab: NONREACTIVE
SIGNAL TO CUT-OFF: <0.02 (ref <1.00)

## 2021-09-10 ENCOUNTER — Encounter: Payer: Self-pay | Admitting: Internal Medicine

## 2021-09-10 ENCOUNTER — Ambulatory Visit (INDEPENDENT_AMBULATORY_CARE_PROVIDER_SITE_OTHER): Payer: 59 | Admitting: Internal Medicine

## 2021-09-10 VITALS — BP 126/84 | HR 87 | Temp 98.1°F | Resp 16 | Ht 74.0 in | Wt 315.1 lb

## 2021-09-10 DIAGNOSIS — E119 Type 2 diabetes mellitus without complications: Secondary | ICD-10-CM | POA: Diagnosis not present

## 2021-09-10 DIAGNOSIS — I10 Essential (primary) hypertension: Secondary | ICD-10-CM

## 2021-09-10 DIAGNOSIS — E7800 Pure hypercholesterolemia, unspecified: Secondary | ICD-10-CM | POA: Diagnosis not present

## 2021-09-10 DIAGNOSIS — Z09 Encounter for follow-up examination after completed treatment for conditions other than malignant neoplasm: Secondary | ICD-10-CM

## 2021-09-10 LAB — BASIC METABOLIC PANEL
BUN: 12 mg/dL (ref 6–23)
CO2: 24 mEq/L (ref 19–32)
Calcium: 9.3 mg/dL (ref 8.4–10.5)
Chloride: 103 mEq/L (ref 96–112)
Creatinine, Ser: 0.99 mg/dL (ref 0.40–1.50)
GFR: 94.47 mL/min (ref >60.00)
Glucose, Bld: 95 mg/dL (ref 70–99)
Potassium: 4.1 mEq/L (ref 3.5–5.1)
Sodium: 135 mEq/L (ref 135–145)

## 2021-09-10 LAB — HEMOGLOBIN A1C: Hgb A1c MFr Bld: 6.4 % (ref 4.6–6.5)

## 2021-09-10 NOTE — Progress Notes (Signed)
? ?  Subjective:  ? ? Patient ID: Ralph [name], male    DOB: May 16, 1981, 41 y.o.   MRN: [id number] ? ?DOS:  09/10/2021 ?Type of visit - description: f/u ? ?Dx with diabetes few months ago. ?Since then he has tried to cut down on portions, + weight loss noted. ? ?Denies any lower extremity paresthesias ?No polyuria or polyphagia ?No visual disturbances ? ?Wt Readings from Last 3 Encounters:  ?09/10/21 (!) 315 lb 2 oz (142.9 kg)  ?05/08/21 (!) 322 lb 2 oz (146.1 kg)  ?05/03/20 290 lb (131.5 kg)  ? ? ? ?Review of Systems ?See above  ? ?Past Medical History:  ?Diagnosis Date  ? Abnormal EKG 11/11/2010  ? Diabetes mellitus without complication (HCC)   ? HTN (hypertension) 11/11/2010  ? Kidney stone 09/29/2011  ? ? ?Past Surgical History:  ?Procedure Laterality Date  ? NECK SURGERY    ? cyst?  ? ? ?Current Outpatient Medications  ?Medication Instructions  ? amLODipine (NORVASC) 10 mg, Oral, Daily  ? losartan (COZAAR) 50 mg, Oral, Daily  ? ? ?   ?Objective:  ? Physical Exam ?BP 126/84 (BP Location: Left Arm, Patient Position: Sitting, Cuff Size: Normal)   Pulse 87   Temp 98.1 ?F (36.7 ?C) (Oral)   Resp 16   Ht 6\' 2"  (1.88 m)   Wt (!) 315 lb 2 oz (142.9 kg)   SpO2 94%   BMI 40.46 kg/m?  ?General:   ?Well developed, NAD, BMI noted. ?HEENT:  ?Normocephalic . Face symmetric, atraumatic ? Skin: Not pale. Not jaundice ?Neurologic:  ?alert & oriented X3.  ?Speech normal, gait appropriate for age and unassisted ?Psych--  ?Cognition and judgment appear intact.  ?Cooperative with normal attention span and concentration.  ?Behavior appropriate. ?No anxious or depressed appearing.  ? ?   ?Assessment   ? ? Assessment ?DM Dx  (A1C = 6.7)  ?HTN - self d/c losartan ~ 11-2015 , back on it as off 04-2019 ?Morbid obesity ?Kidney stones   ?Abnormal EKG, T waves, saw cards 2012, echo (-) ?OSA - on CPAP ? ? ?PLAN: ?DM: New onset, Dx December 2022, A1c was 6.7.  We had a long conversation about what is diabetes , diet and exercise.   Encouraged to decrease carbohydrate intake including bread, pasta, potatoes, candy, regular solids.  He verbalized understanding.  Also recommend portion control. ?Encouraged self learning.  Check A1c. ?Dyslipidemia: LDL December 2022 was 143, in light of diabetes he needs treatment.  This was explained to the patient, he is quite reluctant to take essentially any medication.  Advised that with better diet his cholesterol should improve to some extent as well. ?January 2023 amlodipine, losartan.  Check BMP ?RTC 4 months ?  ? ?This visit occurred during the SARS-CoV-2 public health emergency.  Safety protocols were in place, including screening questions prior to the visit, additional usage of staff PPE, and extensive cleaning of exam room while observing appropriate contact time as indicated for disinfecting solutions.  ? ?

## 2021-09-10 NOTE — Patient Instructions (Addendum)
Per our records you are due for your diabetic eye exam. Please contact your eye doctor to schedule an appointment. Please have them send copies of your office visit notes to [place]. Our fax number is [phone]. If you need a referral to an eye doctor please let us know. ? ?To learn more about diabetes visit the American diabetes Association website ? ?You can also go to the website of the [facility] ? ? ?GO TO THE LAB : Get the blood work   ? ? ?GO TO THE FRONT DESK, PLEASE SCHEDULE YOUR APPOINTMENTS ?Come back for a check up in 4 months  ?

## 2021-09-11 DIAGNOSIS — E7800 Pure hypercholesterolemia, unspecified: Secondary | ICD-10-CM | POA: Insufficient documentation

## 2021-09-11 NOTE — Assessment & Plan Note (Signed)
DM: New onset, Dx December 2022, A1c was 6.7.  We had a long conversation about what is diabetes , diet and exercise.  Encouraged to decrease carbohydrate intake including bread, pasta, potatoes, candy, regular solids.  He verbalized understanding.  Also recommend portion control. ?Encouraged self learning.  Check A1c. ?Dyslipidemia: LDL December 2022 was 143, in light of diabetes he needs treatment.  This was explained to the patient, he is quite reluctant to take essentially any medication.  Advised that with better diet his cholesterol should improve to some extent as well. ?[id number] amlodipine, losartan.  Check BMP ?RTC 4 months ?

## 2022-01-02 ENCOUNTER — Encounter: Payer: Self-pay | Admitting: Internal Medicine

## 2022-01-27 ENCOUNTER — Ambulatory Visit: Payer: Self-pay | Admitting: Internal Medicine

## 2022-01-27 ENCOUNTER — Encounter: Payer: Self-pay | Admitting: Internal Medicine

## 2022-05-09 ENCOUNTER — Encounter: Payer: 59 | Admitting: Internal Medicine

## 2022-05-20 ENCOUNTER — Ambulatory Visit (INDEPENDENT_AMBULATORY_CARE_PROVIDER_SITE_OTHER): Payer: Commercial Managed Care - HMO | Admitting: Internal Medicine

## 2022-05-20 ENCOUNTER — Encounter: Payer: Self-pay | Admitting: Internal Medicine

## 2022-05-20 VITALS — BP 136/88 | HR 94 | Temp 98.2°F | Resp 18 | Ht 74.0 in | Wt 306.4 lb

## 2022-05-20 DIAGNOSIS — Z0000 Encounter for general adult medical examination without abnormal findings: Secondary | ICD-10-CM | POA: Diagnosis not present

## 2022-05-20 DIAGNOSIS — E119 Type 2 diabetes mellitus without complications: Secondary | ICD-10-CM

## 2022-05-20 DIAGNOSIS — E7800 Pure hypercholesterolemia, unspecified: Secondary | ICD-10-CM

## 2022-05-20 LAB — CBC WITH DIFFERENTIAL/PLATELET
Basophils Absolute: 0 10*3/uL (ref 0.0–0.1)
Basophils Relative: 0.7 % (ref 0.0–3.0)
Eosinophils Absolute: 0.2 10*3/uL (ref 0.0–0.7)
Eosinophils Relative: 2.3 % (ref 0.0–5.0)
HCT: 46.7 % (ref 39.0–52.0)
Hemoglobin: 15.6 g/dL (ref 13.0–17.0)
Lymphocytes Relative: 31.9 % (ref 12.0–46.0)
Lymphs Abs: 2.1 10*3/uL (ref 0.7–4.0)
MCHC: 33.4 g/dL (ref 30.0–36.0)
MCV: 80.3 fl (ref 78.0–100.0)
Monocytes Absolute: 0.6 10*3/uL (ref 0.1–1.0)
Monocytes Relative: 9.2 % (ref 3.0–12.0)
Neutro Abs: 3.7 10*3/uL (ref 1.4–7.7)
Neutrophils Relative %: 55.9 % (ref 43.0–77.0)
Platelets: 324 10*3/uL (ref 150.0–400.0)
RBC: 5.82 Mil/uL — ABNORMAL HIGH (ref 4.22–5.81)
RDW: 15.1 % (ref 11.5–15.5)
WBC: 6.6 10*3/uL (ref 4.0–10.5)

## 2022-05-20 LAB — COMPREHENSIVE METABOLIC PANEL
ALT: 40 U/L (ref 0–53)
AST: 25 U/L (ref 0–37)
Albumin: 4.4 g/dL (ref 3.5–5.2)
Alkaline Phosphatase: 62 U/L (ref 39–117)
BUN: 9 mg/dL (ref 6–23)
CO2: 23 mEq/L (ref 19–32)
Calcium: 9.4 mg/dL (ref 8.4–10.5)
Chloride: 104 mEq/L (ref 96–112)
Creatinine, Ser: 0.99 mg/dL (ref 0.40–1.50)
GFR: 94.01 mL/min (ref >60.00)
Glucose, Bld: 107 mg/dL — ABNORMAL HIGH (ref 70–99)
Potassium: 4.1 mEq/L (ref 3.5–5.1)
Sodium: 139 mEq/L (ref 135–145)
Total Bilirubin: 0.5 mg/dL (ref 0.2–1.2)
Total Protein: 7.2 g/dL (ref 6.0–8.3)

## 2022-05-20 LAB — MICROALBUMIN / CREATININE URINE RATIO
Creatinine,U: 134.8 mg/dL
Microalb Creat Ratio: 0.7 mg/g (ref 0.0–30.0)
Microalb, Ur: 1 mg/dL (ref 0.0–1.9)

## 2022-05-20 LAB — LIPID PANEL
Cholesterol: 224 mg/dL — ABNORMAL HIGH (ref 0–200)
HDL: 50.7 mg/dL (ref >39.00)
LDL Cholesterol: 151 mg/dL — ABNORMAL HIGH (ref 0–99)
NonHDL: 172.96
Total CHOL/HDL Ratio: 4
Triglycerides: 108 mg/dL (ref 0.0–149.0)
VLDL: 21.6 mg/dL (ref 0.0–40.0)

## 2022-05-20 LAB — HEMOGLOBIN A1C: Hgb A1c MFr Bld: 6.3 % (ref 4.6–6.5)

## 2022-05-20 MED ORDER — AMLODIPINE BESYLATE 10 MG PO TABS
10.0000 mg | ORAL_TABLET | Freq: Every day | ORAL | 0 refills | Status: DC
Start: 2022-05-20 — End: 2022-10-20

## 2022-05-20 MED ORDER — LOSARTAN POTASSIUM 50 MG PO TABS: 50.0000 mg | ORAL_TABLET | Freq: Every day | ORAL | 0 refills | Status: DC

## 2022-05-20 NOTE — Progress Notes (Signed)
Subjective:    Patient ID: Ralph [name], male    DOB: 08-Feb-1981, 42 y.o.   MRN: [id number]  DOS:  05/20/2022 Type of visit - description: CPX  Here for CPX Since the last visit has make few changes on his diet.  Weight loss noted. He denies any symptoms.  Wt Readings from Last 3 Encounters:  05/20/22 (!) 306 lb 6 oz (139 kg)  09/10/21 (!) 315 lb 2 oz (142.9 kg)  05/08/21 (!) 322 lb 2 oz (146.1 kg)     Review of Systems  Other than above, a 14 point review of systems is negative     Past Medical History:  Diagnosis Date   Abnormal EKG 11/11/2010   Diabetes mellitus without complication (HCC)    HTN (hypertension) 11/11/2010   Kidney stone 09/29/2011    Past Surgical History:  Procedure Laterality Date   NECK SURGERY     cyst?   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 Strain: Not on file  Food Insecurity: Not on file  Transportation Needs: Not on file  Physical Activity: Not on file  Stress: Not on file  Social Connections: Not on file  Intimate Partner Violence: Not on file    Current Outpatient Medications  Medication Instructions   amLODipine (NORVASC) 10 mg, Oral, Daily   losartan (COZAAR) 50 mg, Oral, Daily       Objective:   Physical Exam BP 136/88   Pulse 94   Temp 98.2 F (36.8 C) (Oral)   Resp 18    Ht 6\' 2"  (1.88 m)   Wt (!) 306 lb 6 oz (139 kg)   SpO2 96%   BMI 39.34 kg/m  General: Well developed, NAD, BMI noted Neck: No  thyromegaly  HEENT:  Normocephalic . Face symmetric, atraumatic Lungs:  CTA B Normal respiratory effort, no intercostal retractions, no accessory muscle use. Heart: RRR,  no murmur.  Abdomen:  Not distended, soft, non-tender. No rebound or rigidity.   DM foot exam: No edema, pinprick examination normal.  Good pulses. Skin: Exposed areas without rash. Not pale. Not jaundice Neurologic:  alert & oriented X3.  Speech normal, gait appropriate for age and unassisted Strength symmetric and appropriate for age.  Psych: Cognition and judgment appear intact.  Cooperative with normal attention span and concentration.  Behavior appropriate. No anxious or depressed appearing.     Assessment    Assessment DM Dx 04-2021  (A1C = 6.7)  HTN - self d/c losartan ~ 11-2015 , back on it as off 04-2019 Morbid obesity Kidney stones   Abnormal EKG, T waves, saw cards 2012, echo (-) OSA - on CPAP   PLAN:  Here for CPX DM, HTN, morbid obesity: We had a long conversation about these conditions particularly diabetes: What is DM is, how to treat it, role of diet -exercise- medications-cholesterol medication also discussed Further advise with results RTC 3 to 4 months

## 2022-05-20 NOTE — Patient Instructions (Addendum)
Vaccines I recommend, you can get it at your pharmacy: Covid booster Pneumonia shot (PNM 20) Flu shot  Check the  blood pressure regularly BP GOAL is between 110/65 and  135/85. If it is consistently higher or lower, let me know   Please see the information about the diabetes test: A1c.    GO TO THE LAB : Get the blood work     GO TO [place], [place] back for   a checkup in 3 to 4 months   Hemoglobin A1C Test Why am I having this test? You may have the hemoglobin A1C test (A1C test) done to: Check your risk for developing diabetes. Diagnose diabetes. Check the long-term control of blood sugar (glucose) in people who have diabetes and help make treatment decisions. This test may be done with other blood glucose tests, such as fasting blood glucose and oral glucose tolerance tests. What is being tested? Hemoglobin is a type of protein in the blood that carries oxygen. Glucose attaches to hemoglobin to form glycated hemoglobin. This test checks the amount of glycated hemoglobin in your blood. This is a good indicator of the average amount of glucose in your blood during the past 2-3 months. What kind of sample is taken?  A blood sample is required for this test. It is usually collected by inserting a needle into a blood vessel. Tell a health care provider about: All medicines you are taking, including vitamins, herbs, eye drops, creams, and over-the-counter medicines. Any blood disorders you have. Any surgeries you have had. Any medical conditions you have. Whether you are pregnant or may be pregnant. How are the results reported? Your results will be reported as a percentage indicating how much of your hemoglobin has glucose attached to it. Your health care provider will compare your results to normal ranges established after testing a large group of people (reference ranges). Reference ranges may vary among labs and hospitals. For this test,  common reference ranges are: Adult or child without diabetes: 4-5.6%. Adult or child with prediabetes: 5.7%-6.4%. Adult or child with diabetes: 6.5% or higher. What do the results mean? If you do not have diabetes: A result within the reference range means that you are not at high risk for diabetes. A result of 5.7-6.4% means that you have a high risk of developing diabetes, and you have prediabetes. Having prediabetes puts you at risk for developing type 2 diabetes. You may have more tests, including a repeat A1C test. Results of 6.5% or higher on two separate A1C tests mean that you have diabetes. You may have more tests to confirm the diagnosis. If you have diabetes: A result of 7% usually means that your diabetes is under control. A result of less than 7% also means that diabetes is under control. This result may be an appropriate goal for those for whom there is a low risk of low blood sugar (hypoglycemia). A result of less than 8% may also mean that diabetes is under control. This result may be an appropriate goal for those who do not benefit from more blood glucose control or who are at high risk for hypoglycemia. Abnormally low A1C values may be caused by: Pregnancy. Severe blood loss. Receiving donated blood (transfusions). Low red blood cell count (anemia). Long-term kidney failure. Some unusual forms (variants) of hemoglobin. Talk with your health care provider about what your results mean. Questions to ask your health care provider Ask your health care provider, or the  department that is doing the test: When will my results be ready? How will I get my results? What are my treatment options? What other tests do I need? What are my next steps? Summary The A1C test is done to check your risk for developing diabetes, diagnose diabetes, and check the long-term control of blood sugar (glucose) in people who have diabetes and help make treatment decisions. Hemoglobin is a type of  protein in the blood that carries oxygen. Glucose attaches to hemoglobin to form glycated hemoglobin. This test checks the amount of glycated hemoglobin in your blood. Talk with your health care provider about what your results mean. This information is not intended to replace advice given to you by your health care provider. Make sure you discuss any questions you have with your health care provider. Document Revised: 12/10/2021 Document Reviewed: 06/19/2021 Elsevier Patient Education  [employer].      Per our records you are due for your diabetic eye exam. Please contact your eye doctor to schedule an appointment. Please have them send copies of your office visit notes to [place]. Our fax number is [phone]. If you need a referral to an eye doctor please let us know.

## 2022-05-20 NOTE — Assessment & Plan Note (Signed)
--  Td 2022 - Covid 19 booster rec - PNM: declines  - flu shot declines - Concerned about cost of vaccines, okay to proceed at the pharmacy. --Labs:CMP FLP CBC A1c micro --Diet exercise: Extensively discussed.

## 2022-05-20 NOTE — Assessment & Plan Note (Signed)
Here for CPX DM, HTN, morbid obesity: We had a long conversation about these conditions particularly diabetes: What is DM is, how to treat it, role of diet -exercise- medications-cholesterol medication also discussed Further advise with results RTC 3 to 4 months

## 2022-08-26 ENCOUNTER — Encounter: Payer: Self-pay | Admitting: Internal Medicine

## 2022-09-01 ENCOUNTER — Encounter: Payer: Self-pay | Admitting: *Deleted

## 2022-09-29 ENCOUNTER — Ambulatory Visit: Payer: Managed Care, Other (non HMO) | Admitting: Internal Medicine

## 2022-10-20 ENCOUNTER — Ambulatory Visit (INDEPENDENT_AMBULATORY_CARE_PROVIDER_SITE_OTHER): Payer: Commercial Managed Care - HMO | Admitting: Internal Medicine

## 2022-10-20 ENCOUNTER — Encounter: Payer: Self-pay | Admitting: Internal Medicine

## 2022-10-20 VITALS — BP 126/82 | HR 92 | Temp 98.3°F | Resp 12 | Ht 74.0 in | Wt 294.4 lb

## 2022-10-20 DIAGNOSIS — I10 Essential (primary) hypertension: Secondary | ICD-10-CM

## 2022-10-20 DIAGNOSIS — Z6837 Body mass index (BMI) 37.0-37.9, adult: Secondary | ICD-10-CM | POA: Diagnosis not present

## 2022-10-20 DIAGNOSIS — E119 Type 2 diabetes mellitus without complications: Secondary | ICD-10-CM

## 2022-10-20 MED ORDER — AMLODIPINE BESYLATE 10 MG PO TABS: 10.0000 mg | ORAL_TABLET | Freq: Every day | ORAL | 3 refills | Status: AC

## 2022-10-20 MED ORDER — LOSARTAN POTASSIUM 50 MG PO TABS: 50.0000 mg | ORAL_TABLET | Freq: Every day | ORAL | 3 refills | Status: AC

## 2022-10-20 NOTE — Progress Notes (Unsigned)
   Subjective:    Patient ID: Ralph [name], male    DOB: March 27, 1981, 42 y.o.   MRN: [id number]  DOS:  10/20/2022 Type of visit - description: f/u Follow-up from previous visit. Recent labs reviewed. Weight loss noted. No recent ambulatory BPs  Wt Readings from Last 3 Encounters:  10/20/22 294 lb 6.4 oz (133.5 kg)  05/20/22 (!) 306 lb 6 oz (139 kg)  09/10/21 (!) 315 lb 2 oz (142.9 kg)     Review of Systems See above   Past Medical History:  Diagnosis Date   Abnormal EKG 11/11/2010   Diabetes mellitus without complication (HCC)    HTN (hypertension) 11/11/2010   Kidney stone 09/29/2011    Past Surgical History:  Procedure Laterality Date   NECK SURGERY     cyst?    Current Outpatient Medications  Medication Instructions   amLODipine (NORVASC) 10 mg, Oral, Daily   losartan (COZAAR) 50 mg, Oral, Daily       Objective:   Physical Exam BP 126/82 (BP Location: Left Arm, Cuff Size: Large)   Pulse 92   Temp 98.3 F (36.8 C) (Oral)   Resp 12   Ht 6\' 2"  (1.88 m)   Wt 294 lb 6.4 oz (133.5 kg)   SpO2 96%   BMI 37.80 kg/m  General:   Well developed, NAD, BMI noted. HEENT:  Normocephalic . Face symmetric, atraumatic Lungs:  CTA B Normal respiratory effort, no intercostal retractions, no accessory muscle use. Heart: RRR,  no murmur.  Lower extremities: no pretibial edema bilaterally  Skin: Not pale. Not jaundice Neurologic:  alert & oriented X3.  Speech normal, gait appropriate for age and unassisted Psych--  Cognition and judgment appear intact.  Cooperative with normal attention span and concentration.  Behavior appropriate. No anxious or depressed appearing.      Assessment     Assessment DM Dx 04-2021  (A1C = 6.7)  HTN - self d/c losartan ~ 11-2015 , back on it as off 04-2019 Dyslipidemia Morbid obesity Kidney stones   Abnormal EKG, T waves, saw cards 2012, echo (-) OSA - on CPAP  PLAN: DM: Diet controlled, last A1c was 6.3, since then he is  eating healthier, has reduced food intake to twice daily and never after 5 PM.  Will recheck A1c. HTN: Seems well-controlled on Norvasc, losartan, check a BMP, RF sent. Dyslipidemia: Total cholesterol 224, LDL 151, was rx atorvastatin 05-2022, declined.  Explained that the fact he has HTN and DM put him in a high risk of CAD and statins will decrease his risk.  He verbalized understanding but still declines Morbid obesity: Making some progress,  praised! preventive care: COVID booster recommended.  Declined it. RTC CPX 7 months.

## 2022-10-20 NOTE — Patient Instructions (Signed)
Check the  blood pressure regularly BP GOAL is between 110/65 and  135/85. If it is consistently higher or lower, let me know    GO TO THE LAB : Get the blood work     GO TO THE FRONT DESK, PLEASE SCHEDULE YOUR APPOINTMENTS Come back for  a physical exam by 05-2023

## 2022-10-21 LAB — BASIC METABOLIC PANEL
BUN: 9 mg/dL (ref 6–23)
CO2: 29 mEq/L (ref 19–32)
Calcium: 9.7 mg/dL (ref 8.4–10.5)
Chloride: 103 mEq/L (ref 96–112)
Creatinine, Ser: 1.21 mg/dL (ref 0.40–1.50)
GFR: 73.67 mL/min (ref >60.00)
Glucose, Bld: 80 mg/dL (ref 70–99)
Potassium: 4.1 mEq/L (ref 3.5–5.1)
Sodium: 140 mEq/L (ref 135–145)

## 2022-10-21 LAB — HEMOGLOBIN A1C: Hgb A1c MFr Bld: 5.8 % (ref 4.6–6.5)

## 2022-10-21 NOTE — Assessment & Plan Note (Signed)
DM: Diet controlled, last A1c was 6.3, since then he is eating healthier, has reduced food intake to twice daily and never after 5 PM.  Will recheck A1c. HTN: Seems well-controlled on Norvasc, losartan, check a BMP, RF sent. Dyslipidemia: Total cholesterol 224, LDL 151, was rx atorvastatin 05-2022, declined.  Explained that the fact he has HTN and DM put him in a high risk of CAD and statins will decrease his risk.  He verbalized understanding but still declines Morbid obesity: Making some progress,  praised! preventive care: COVID booster recommended.  Declined it. RTC CPX 7 months.

## 2022-11-27 ENCOUNTER — Emergency Department (HOSPITAL_COMMUNITY)
Admission: EM | Admit: 2022-11-27 | Discharge: 2022-11-27 | Disposition: A | Discharge status: Home / Self Care | Payer: Commercial Managed Care - HMO | Attending: Emergency Medicine | Admitting: Emergency Medicine

## 2022-11-27 ENCOUNTER — Other Ambulatory Visit: Payer: Self-pay

## 2022-11-27 ENCOUNTER — Encounter (HOSPITAL_COMMUNITY): Payer: Self-pay | Admitting: Emergency Medicine

## 2022-11-27 DIAGNOSIS — N390 Urinary tract infection, site not specified: Secondary | ICD-10-CM | POA: Diagnosis not present

## 2022-11-27 DIAGNOSIS — R319 Hematuria, unspecified: Secondary | ICD-10-CM | POA: Diagnosis present

## 2022-11-27 LAB — BASIC METABOLIC PANEL
Anion gap: 9 (ref 5–15)
BUN: 11 mg/dL (ref 6–20)
CO2: 23 mmol/L (ref 22–32)
Calcium: 9 mg/dL (ref 8.9–10.3)
Chloride: 106 mmol/L (ref 98–111)
Creatinine, Ser: 1.26 mg/dL — ABNORMAL HIGH (ref 0.61–1.24)
GFR, Estimated: >60 mL/min (ref >60)
Glucose, Bld: 98 mg/dL (ref 70–99)
Potassium: 3.6 mmol/L (ref 3.5–5.1)
Sodium: 138 mmol/L (ref 135–145)

## 2022-11-27 LAB — CBC
HCT: 45.4 % (ref 39.0–52.0)
Hemoglobin: 15.2 g/dL (ref 13.0–17.0)
MCH: 26.6 pg (ref 26.0–34.0)
MCHC: 33.5 g/dL (ref 30.0–36.0)
MCV: 79.5 fL — ABNORMAL LOW (ref 80.0–100.0)
Platelets: 366 10*3/uL (ref 150–400)
RBC: 5.71 MIL/uL (ref 4.22–5.81)
RDW: 13.9 % (ref 11.5–15.5)
WBC: 12.8 10*3/uL — ABNORMAL HIGH (ref 4.0–10.5)
nRBC: 0 % (ref 0.0–0.2)

## 2022-11-27 LAB — URINALYSIS, ROUTINE W REFLEX MICROSCOPIC
Bilirubin Urine: NEGATIVE
Glucose, UA: NEGATIVE mg/dL
Ketones, ur: NEGATIVE mg/dL
Nitrite: NEGATIVE
Protein, ur: 100 mg/dL — AB
RBC / HPF: >50 RBC/hpf (ref 0–5)
Specific Gravity, Urine: 1.019 (ref 1.005–1.030)
WBC, UA: >50 WBC/hpf (ref 0–5)
pH: 5 (ref 5.0–8.0)

## 2022-11-27 MED ORDER — CEFPODOXIME PROXETIL 200 MG PO TABS: 200.0000 mg | ORAL_TABLET | Freq: Two times a day (BID) | ORAL | 0 refills | Status: AC

## 2022-11-27 NOTE — ED Triage Notes (Signed)
Pt reports blood in urine starting today. Pain when voiding towards end.

## 2022-11-27 NOTE — Discharge Instructions (Addendum)
Your urinalysis shows a urinary tract infection.  You are being given 7 days of antibiotics to treat this.  Follow-up with the urologist, call their office tomorrow.  If you develop fever, vomiting, abdominal pain, flank pain, or any other new/concerning symptoms then return to the ER or call 911.

## 2022-11-27 NOTE — ED Provider Notes (Signed)
[facility] AT [facility] Provider Note   CSN: [id number] Arrival date & time: 11/27/22  1237     History  Chief Complaint  Patient presents with   Hematuria    Ralph [name] is a 42 y.o. male.  HPI 42 year old male presents with hematuria and dysuria.  Started this morning.  He noticed that his stream was weak this morning.  When he urinated the first couple times it was in the dark so he did not notice whether or not there was blood.  However he would have pain at the end of urination at the tip of his penis.  Later he did look and there was bloody urine at the end of the stream along with the dysuria.  He feels like he is not urinating more often than normal.  He is urinating the normal amount.  He denies any flank or abdominal pain.  No testicular pain or concern for STI.  No rectal pain.  Home Medications Prior to Admission medications   Medication Sig Start Date End Date Taking? Authorizing Provider  cefpodoxime (VANTIN) 200 MG tablet Take 1 tablet (200 mg total) by mouth 2 (two) times daily for 7 days. 11/27/22 12/04/22 Yes Pricilla Loveless, MD  amLODipine (NORVASC) 10 MG tablet Take 1 tablet (10 mg total) by mouth daily. 10/20/22   Wanda Plump, MD  losartan (COZAAR) 50 MG tablet Take 1 tablet (50 mg total) by mouth daily. 10/20/22   Wanda Plump, MD      Allergies    Patient has no known allergies.    Review of Systems   Review of Systems  Gastrointestinal:  Negative for abdominal pain.  Genitourinary:  Positive for dysuria and hematuria. Negative for flank pain, frequency and testicular pain.  Musculoskeletal:  Negative for back pain.    Physical Exam Updated Vital Signs BP (!) 146/113 (BP Location: Left Arm)   Pulse (!) 109   Temp 98.4 F (36.9 C) (Oral)   Resp 18   Ht 6\' 2"  (1.88 m)   Wt 133 kg   SpO2 97%   BMI 37.65 kg/m  Physical Exam Vitals and nursing note reviewed.  Constitutional:      General: He is not in acute distress.     Appearance: He is well-developed. He is not ill-appearing or diaphoretic.  HENT:     Head: Normocephalic and atraumatic.  Cardiovascular:     Rate and Rhythm: Normal rate and regular rhythm.     Heart sounds: Normal heart sounds.  Pulmonary:     Effort: Pulmonary effort is normal.     Breath sounds: Normal breath sounds.  Abdominal:     General: There is no distension.     Palpations: Abdomen is soft.     Tenderness: There is no abdominal tenderness. There is no right CVA tenderness or left CVA tenderness.  Skin:    General: Skin is warm and dry.  Neurological:     Mental Status: He is alert.     ED Results / Procedures / Treatments   Labs (all labs ordered are listed, but only abnormal results are displayed) Labs Reviewed  URINALYSIS, ROUTINE W REFLEX MICROSCOPIC - Abnormal; Notable for the following components:      Result Value   Color, Urine AMBER (*)    APPearance CLOUDY (*)    Hgb urine dipstick LARGE (*)    Protein, ur 100 (*)    Leukocytes,Ua LARGE (*)    Bacteria, UA  MANY (*)    All other components within normal limits  CBC - Abnormal; Notable for the following components:   WBC 12.8 (*)    MCV 79.5 (*)    All other components within normal limits  BASIC METABOLIC PANEL - Abnormal; Notable for the following components:   Creatinine, Ser 1.26 (*)    All other components within normal limits  URINE CULTURE    EKG None  Radiology No results found.  Procedures Procedures    Medications Ordered in ED Medications - No data to display  ED Course/ Medical Decision Making/ A&P                             Medical Decision Making Amount and/or Complexity of Data Reviewed Labs: ordered.    Details: UA - UTI Mild leukocytosis  Risk Prescription drug management.   Patient presents with dysuria and hematuria. He is voiding ok, low suspicion for urinary retention. UA c/w UTI. I suspect that's why he's having the blood and dysuria. He has no rectal  pain, and I think prostatitis is unlikely. He denies any concern for STI, and doesn't have any discharge. Will treat with vantin, send urine for culture (since it's complicated as he's a male) and refer to urology.  He doesn't have any abdominal pain, rectal pain, or flank pain. No indication for imaging. No signs/symptoms of sepsis.   HR was initially high, but down to 90s upon discharge while I was in the room. Stable for d/c with return precautions.         Final Clinical Impression(s) / ED Diagnoses Final diagnoses:  Acute urinary tract infection    Rx / DC Orders ED Discharge Orders          Ordered    cefpodoxime (VANTIN) 200 MG tablet  2 times daily        11/27/22 1454              Pricilla Loveless, MD 11/27/22 1520

## 2022-11-28 LAB — URINE CULTURE

## 2022-11-29 LAB — URINE CULTURE: Culture: >=100000 — AB

## 2022-12-06 IMAGING — CR DG ANKLE COMPLETE 3+V*L*
3 series · 3 of 3 positions shown · non-contrast
Comparison: None.

CLINICAL DATA: Pain and swelling after trauma

EXAM:
LEFT ANKLE COMPLETE - 3+ VIEW

[t ankle joint ap left]
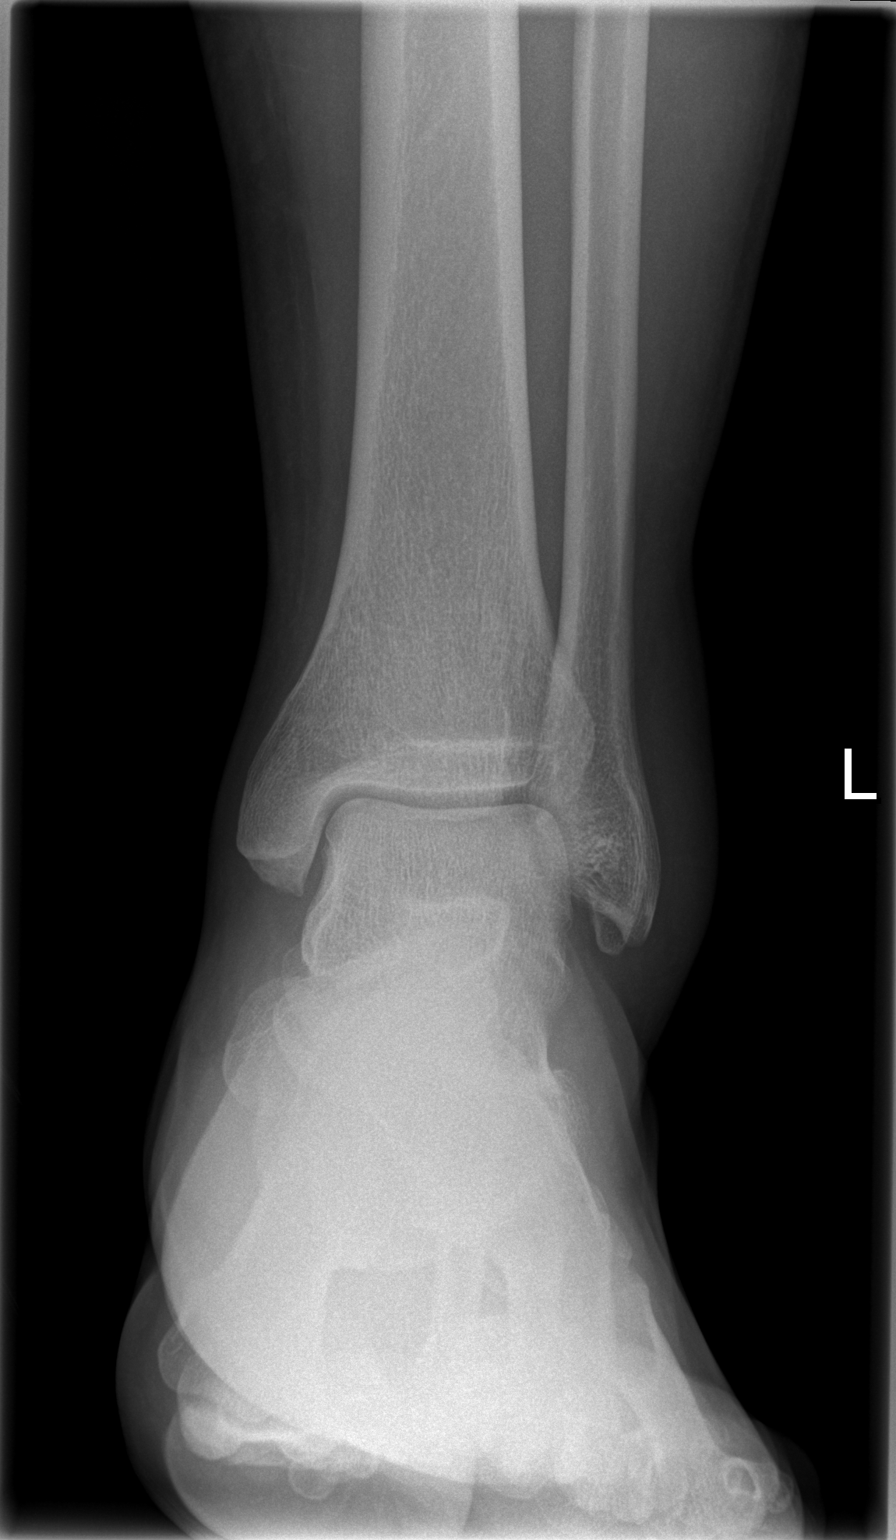

[t ankle joint oblique left]
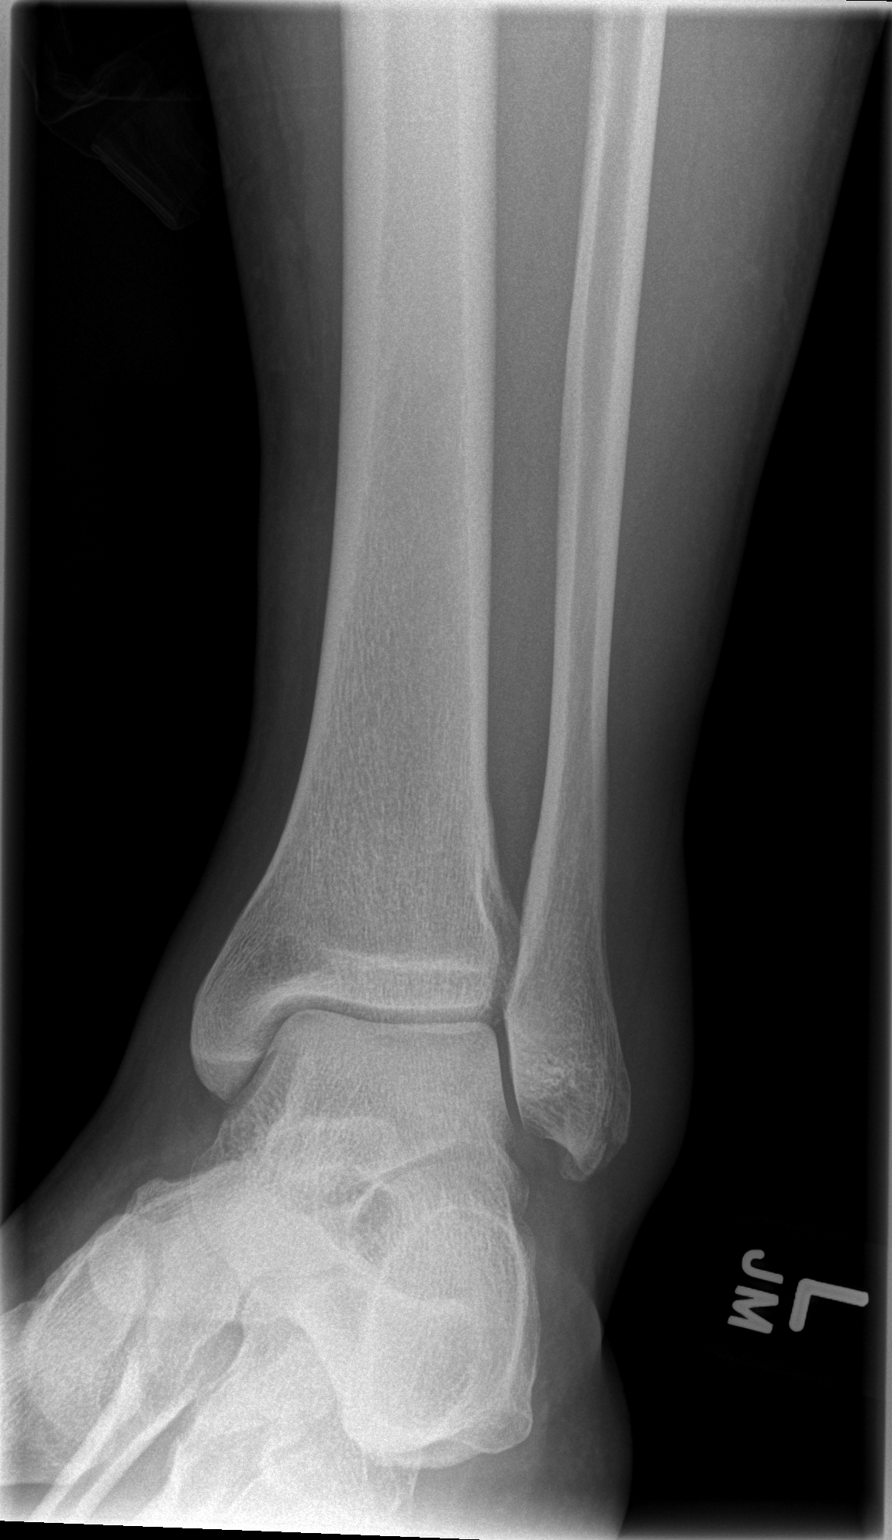

[t ankle joint lat left]
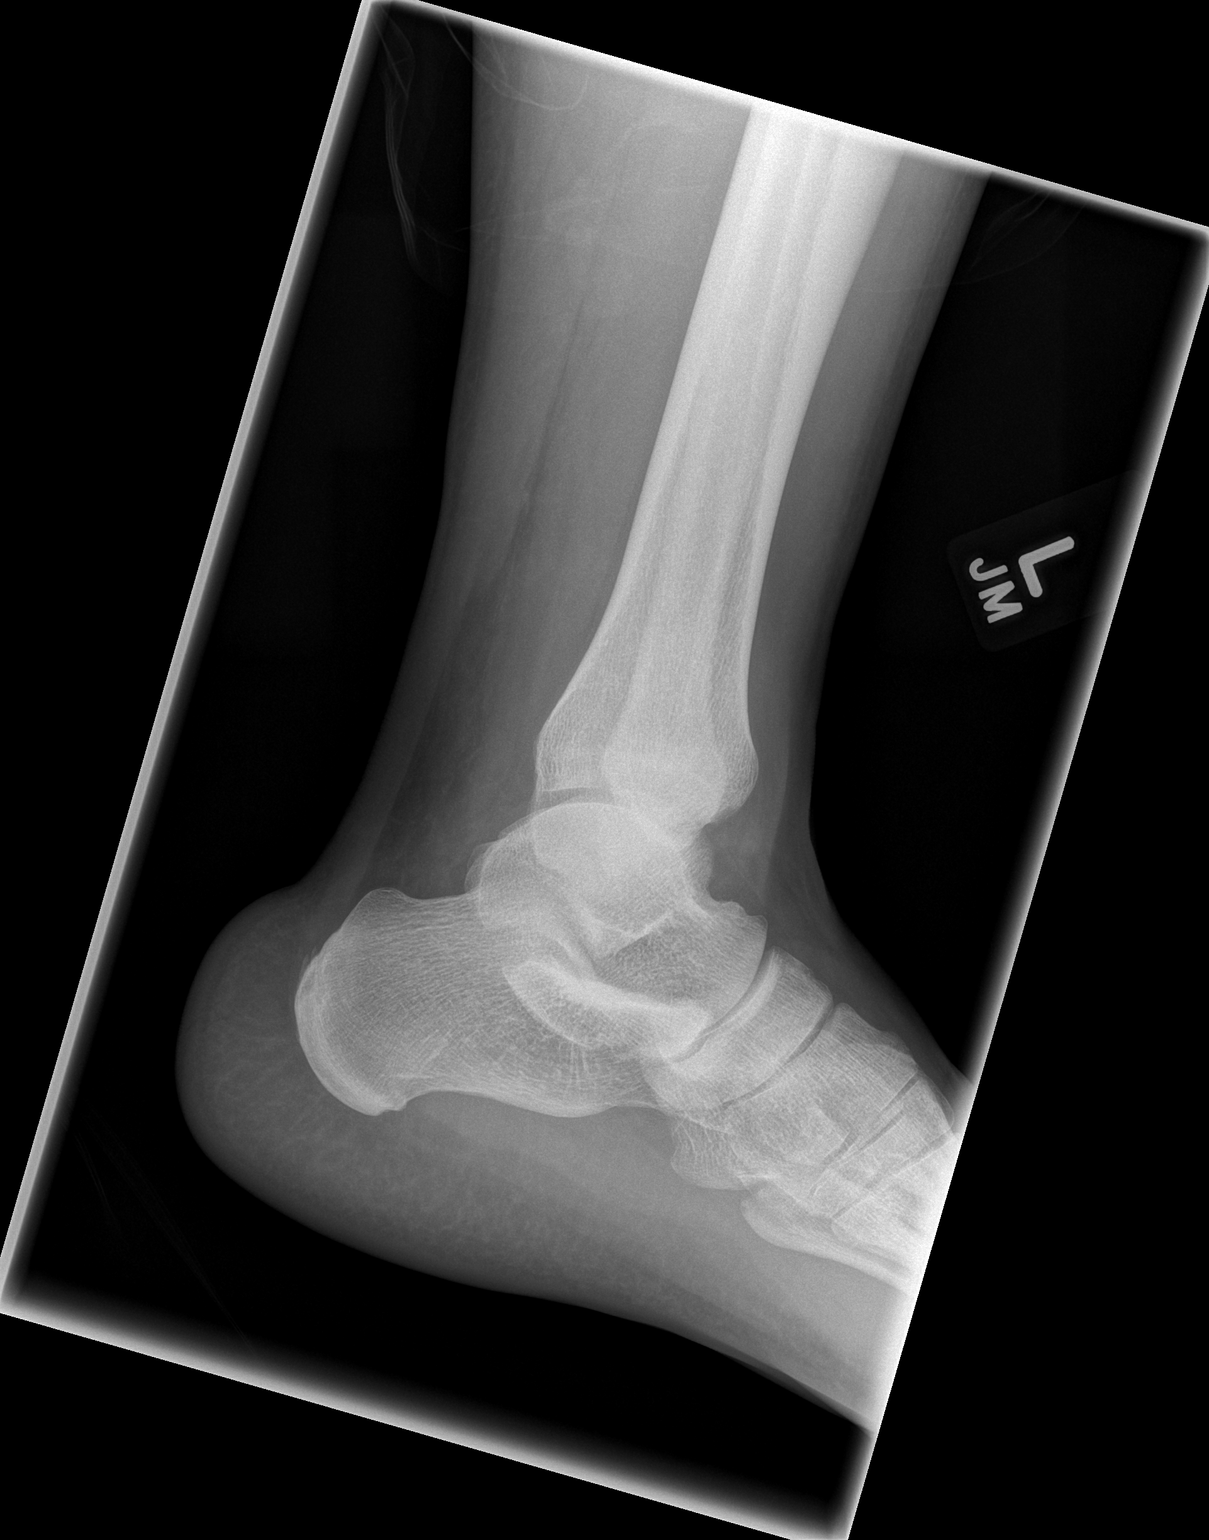

[3 of 3 positions shown; findings below may reference images not displayed]

FINDINGS: Frontal, oblique, and lateral views were obtained. There is soft
tissue swelling. No appreciable fracture or joint effusion. No
appreciable joint space narrowing or erosion. Ankle mortise appears
intact.
IMPRESSION: Soft tissue swelling. No evident fracture or arthropathy. Ankle
mortise appears intact.

## 2023-04-09 ENCOUNTER — Encounter: Payer: Self-pay | Admitting: Internal Medicine

## 2024-05-23 ENCOUNTER — Encounter: Payer: Self-pay | Admitting: Internal Medicine

## 2024-05-23 ENCOUNTER — Encounter: Payer: Commercial Managed Care - HMO | Admitting: Internal Medicine
